# Patient Record
Sex: Female | Born: 1960 | Race: Black or African American | Hispanic: No | Marital: Married | Smoking: Former smoker
Health system: Southern US, Community
[De-identification: ages and names within clinical notes are randomized; demographics above are authoritative.]

## PROBLEM LIST (undated history)

## (undated) DIAGNOSIS — I10 Essential (primary) hypertension: Secondary | ICD-10-CM

## (undated) DIAGNOSIS — E079 Disorder of thyroid, unspecified: Secondary | ICD-10-CM

## (undated) HISTORY — PX: ABDOMINAL HYSTERECTOMY: SHX81

## (undated) HISTORY — DX: Essential (primary) hypertension: I10

## (undated) HISTORY — DX: Disorder of thyroid, unspecified: E07.9

## (undated) HISTORY — PX: TUBAL LIGATION: SHX77

---

## 2005-10-19 ENCOUNTER — Ambulatory Visit: Payer: Self-pay | Admitting: Family Medicine

## 2006-11-08 LAB — HM PAP SMEAR: HM PAP: NORMAL

## 2006-11-16 ENCOUNTER — Ambulatory Visit: Payer: Self-pay | Admitting: Family Medicine

## 2007-11-28 ENCOUNTER — Ambulatory Visit: Payer: Self-pay | Admitting: Family Medicine

## 2008-12-17 ENCOUNTER — Ambulatory Visit: Payer: Self-pay | Admitting: Family Medicine

## 2010-02-03 ENCOUNTER — Ambulatory Visit: Payer: Self-pay | Admitting: Family Medicine

## 2011-03-10 ENCOUNTER — Ambulatory Visit: Payer: Self-pay | Admitting: Family Medicine

## 2012-02-03 LAB — HM DEXA SCAN

## 2012-03-29 ENCOUNTER — Ambulatory Visit: Payer: Self-pay | Admitting: Family Medicine

## 2013-04-02 ENCOUNTER — Ambulatory Visit: Payer: Self-pay | Admitting: Family Medicine

## 2014-02-11 LAB — LIPID PANEL
Cholesterol: 188 mg/dL (ref 0–200)
HDL: 57 mg/dL (ref 35–70)
LDL Cholesterol: 117 mg/dL
LDl/HDL Ratio: 2.1
Triglycerides: 71 mg/dL (ref 40–160)

## 2014-02-11 LAB — HEPATIC FUNCTION PANEL
ALK PHOS: 61 U/L (ref 25–125)
ALT: 13 U/L (ref 7–35)
AST: 15 U/L (ref 13–35)
Bilirubin, Total: 0.4 mg/dL

## 2014-02-11 LAB — BASIC METABOLIC PANEL
BUN: 14 mg/dL (ref 4–21)
CREATININE: 0.7 mg/dL (ref <=1.1)
Glucose: 99 mg/dL
Potassium: 4 mmol/L (ref 3.4–5.3)
SODIUM: 138 mmol/L (ref 137–147)

## 2014-02-11 LAB — TSH: TSH: 1.55 u[IU]/mL (ref <=5.90)

## 2014-02-11 LAB — CBC AND DIFFERENTIAL
HEMATOCRIT: 41 % (ref 36–46)
Hemoglobin: 13.5 g/dL (ref 12.0–16.0)
Neutrophils Absolute: 42 /uL
Platelets: 301 10*3/uL (ref 150–399)

## 2014-02-14 LAB — CBC AND DIFFERENTIAL: WBC: 3.2 10^3/mL

## 2014-04-29 ENCOUNTER — Ambulatory Visit: Payer: Self-pay | Admitting: Family Medicine

## 2014-04-29 LAB — HM MAMMOGRAPHY: HM Mammogram: NORMAL

## 2015-01-17 DIAGNOSIS — E559 Vitamin D deficiency, unspecified: Secondary | ICD-10-CM | POA: Insufficient documentation

## 2015-01-17 DIAGNOSIS — E039 Hypothyroidism, unspecified: Secondary | ICD-10-CM | POA: Insufficient documentation

## 2015-01-17 DIAGNOSIS — D259 Leiomyoma of uterus, unspecified: Secondary | ICD-10-CM | POA: Insufficient documentation

## 2015-01-17 DIAGNOSIS — D649 Anemia, unspecified: Secondary | ICD-10-CM | POA: Insufficient documentation

## 2015-01-17 DIAGNOSIS — E010 Iodine-deficiency related diffuse (endemic) goiter: Secondary | ICD-10-CM | POA: Insufficient documentation

## 2015-01-17 DIAGNOSIS — E049 Nontoxic goiter, unspecified: Secondary | ICD-10-CM | POA: Insufficient documentation

## 2015-03-18 ENCOUNTER — Encounter: Payer: Self-pay | Admitting: Family Medicine

## 2015-03-18 ENCOUNTER — Ambulatory Visit (INDEPENDENT_AMBULATORY_CARE_PROVIDER_SITE_OTHER): Payer: Managed Care, Other (non HMO) | Admitting: Family Medicine

## 2015-03-18 VITALS — BP 120/60 | HR 76 | Temp 98.7°F | Resp 16 | Ht 61.5 in | Wt 134.6 lb

## 2015-03-18 DIAGNOSIS — Z1211 Encounter for screening for malignant neoplasm of colon: Secondary | ICD-10-CM

## 2015-03-18 DIAGNOSIS — Z0000 Encounter for general adult medical examination without abnormal findings: Secondary | ICD-10-CM | POA: Diagnosis not present

## 2015-03-18 DIAGNOSIS — Z124 Encounter for screening for malignant neoplasm of cervix: Secondary | ICD-10-CM

## 2015-03-18 LAB — IFOBT (OCCULT BLOOD): IFOBT: NEGATIVE

## 2015-03-18 NOTE — Progress Notes (Signed)
Patient ID: Cynthia Blankenship, female   DOB: 30-Oct-1960, 54 y.o.   MRN: [id number]       Patient: Cynthia Blankenship, Female    DOB: [birth year], 54 y.o.   MRN: [id number] Visit Date: 03/18/2015  Today's Provider: Wilhemena Durie, MD   Chief Complaint  Patient presents with  . Annual Exam    Last annual Visit was 02/11/2014  . Allergies    seasonal   Subjective:    Annual physical exam Cynthia Blankenship is a 54 y.o. female who presents today for health maintenance and complete physical. She feels well. She reports exercising: walks twice a week(7,000 steps, 30 mins a day). She reports she is sleeping well.  -----------------------------------------------------------------   Review of Systems  Constitutional: Negative.   HENT: Negative.   Eyes: Positive for itching.  Respiratory: Negative.   Cardiovascular: Negative.   Gastrointestinal: Negative.        Reports taking colace for constipation if needed." have not taking any in the last past 2 months"  Endocrine: Negative.   Genitourinary: Negative.   Musculoskeletal: Negative for joint swelling.       Hands feels tightness after work. And hand pain.  Skin: Negative.   Allergic/Immunologic: Positive for environmental allergies.       Seasonal allergies. PT reports taking benadryl for the allergies.  Neurological: Negative.   Hematological: Negative.   Psychiatric/Behavioral: Negative.     Social History She  reports that she has quit smoking. She does not have any smokeless tobacco history on file.  Patient Active Problem List   Diagnosis Date Noted  . Absolute anemia 01/17/2015  . 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 Adult hypothyroidism 01/17/2015  . Leiomyoma of uterus 01/17/2015  . Avitaminosis D 01/17/2015    Past Surgical History  Procedure Laterality Date  . Tubal ligation    . Abdominal hysterectomy      Family History Her family history includes Alcohol abuse in her father; Cirrhosis in her father; Hypertension  in her mother.    Previous Medications   CHOLECALCIFEROL (VITAMIN D) 1000 UNITS TABLET    Take by mouth.   HYDROCORTISONE (ANUSOL-HC) 2.5 % RECTAL CREAM    Place rectally.   LEVOTHYROXINE (SYNTHROID, LEVOTHROID) 75 MCG TABLET    Take by mouth.    Patient Care Team: Jerrol Banana., MD as PCP - General (Family Medicine)     Objective:   Vitals: BP 120/60 mmHg  Pulse 76  Temp(Src) 98.7 F (37.1 C) (Oral)  Resp 16  Ht 5' 1.5" (1.562 m)  Wt 134 lb 9.6 oz (61.054 kg)  BMI 25.02 kg/m2   Physical Exam  Constitutional: She is oriented to person, place, and time. She appears well-developed and well-nourished.  HENT:  Head: Normocephalic and atraumatic.  Right Ear: External ear normal.  Left Ear: External ear normal.  Nose: Nose normal.  Mouth/Throat: Oropharynx is clear and moist.  Eyes: Conjunctivae and EOM are normal. Pupils are equal, round, and reactive to light.  Neck: Normal range of motion. Neck supple.  Cardiovascular: Normal rate, regular rhythm, normal heart sounds and intact distal pulses.   Pulmonary/Chest: Effort normal and breath sounds normal.  Abdominal: Soft. Bowel sounds are normal.  Genitourinary: Vagina normal. Guaiac negative stool. No breast swelling, tenderness or discharge. Pelvic exam was performed with patient supine. No vaginal discharge found.  Patient status post hysterectomy  Musculoskeletal: Normal range of motion.  Neurological: She is alert and oriented to person, place, and time.  Skin: Skin is warm and dry.  Psychiatric: She has a normal mood and affect. Her behavior is normal. Judgment and thought content normal.     Depression Screen No flowsheet data found.    Assessment & Plan:     Routine Health Maintenance and Physical Exam  Exercise Activities and Dietary recommendations Goals    None      Immunization History  Administered Date(s) Administered  . Tdap 11/27/2007    Health Maintenance  Topic Date Due  . HIV  Screening  01/21/1976  . PAP SMEAR  11/08/2009  . COLONOSCOPY  01/21/2011  . INFLUENZA VACCINE  04/14/2015  . MAMMOGRAM  04/29/2016  . TETANUS/TDAP  11/26/2017      Discussed health benefits of physical activity, and encouraged her to engage in regular exercise appropriate for her age and condition.   Patient status post hysterectomy for profound menorrhagia   Hypothyroidism --------------------------------------------------------------------   I have done the exam and reviewed the above chart and it is accurate to the best of my knowledge.

## 2015-03-18 NOTE — Progress Notes (Deleted)
.  rlg

## 2015-03-19 LAB — COMPREHENSIVE METABOLIC PANEL
ALK PHOS: 69 IU/L (ref 39–117)
ALT: 26 IU/L (ref 0–32)
AST: 20 IU/L (ref 0–40)
Albumin/Globulin Ratio: 1.3 (ref 1.1–2.5)
Albumin: 4.4 g/dL (ref 3.5–5.5)
BUN / CREAT RATIO: 22 (ref 9–23)
BUN: 15 mg/dL (ref 6–24)
Bilirubin Total: 0.2 mg/dL (ref 0.0–1.2)
CHLORIDE: 102 mmol/L (ref 97–108)
CO2: 23 mmol/L (ref 18–29)
Calcium: 10 mg/dL (ref 8.7–10.2)
Creatinine, Ser: 0.68 mg/dL (ref 0.57–1.00)
GFR calc Af Amer: 115 mL/min/{1.73_m2} (ref >59)
GFR calc non Af Amer: 99 mL/min/{1.73_m2} (ref >59)
Globulin, Total: 3.4 g/dL (ref 1.5–4.5)
Glucose: 103 mg/dL — ABNORMAL HIGH (ref 65–99)
POTASSIUM: 4.6 mmol/L (ref 3.5–5.2)
SODIUM: 140 mmol/L (ref 134–144)
Total Protein: 7.8 g/dL (ref 6.0–8.5)

## 2015-03-19 LAB — CBC WITH DIFFERENTIAL/PLATELET
BASOS ABS: 0 10*3/uL (ref 0.0–0.2)
Basos: 0 %
EOS (ABSOLUTE): 0.1 10*3/uL (ref 0.0–0.4)
Eos: 3 %
HEMOGLOBIN: 14 g/dL (ref 11.1–15.9)
Hematocrit: 40.6 % (ref 34.0–46.6)
IMMATURE GRANS (ABS): 0 10*3/uL (ref 0.0–0.1)
IMMATURE GRANULOCYTES: 0 %
LYMPHS: 50 %
Lymphocytes Absolute: 1.8 10*3/uL (ref 0.7–3.1)
MCH: 31.2 pg (ref 26.6–33.0)
MCHC: 34.5 g/dL (ref 31.5–35.7)
MCV: 90 fL (ref 79–97)
MONOCYTES: 11 %
Monocytes Absolute: 0.4 10*3/uL (ref 0.1–0.9)
NEUTROS ABS: 1.3 10*3/uL — AB (ref 1.4–7.0)
Neutrophils: 36 %
Platelets: 291 10*3/uL (ref 150–379)
RBC: 4.49 x10E6/uL (ref 3.77–5.28)
RDW: 13.9 % (ref 12.3–15.4)
WBC: 3.7 10*3/uL (ref 3.4–10.8)

## 2015-03-19 LAB — LIPID PANEL WITH LDL/HDL RATIO
CHOLESTEROL TOTAL: 205 mg/dL — AB (ref 100–199)
HDL: 60 mg/dL (ref >39)
LDL Calculated: 125 mg/dL — ABNORMAL HIGH (ref 0–99)
LDl/HDL Ratio: 2.1 ratio units (ref 0.0–3.2)
Triglycerides: 99 mg/dL (ref 0–149)
VLDL Cholesterol Cal: 20 mg/dL (ref 5–40)

## 2015-03-19 LAB — TSH: TSH: 0.841 u[IU]/mL (ref 0.450–4.500)

## 2015-03-21 ENCOUNTER — Telehealth: Payer: Self-pay

## 2015-03-21 LAB — PAPLB, HPV, RFX16/18
HPV, high-risk: NEGATIVE
PAP SMEAR COMMENT: 0

## 2015-03-21 NOTE — Telephone Encounter (Signed)
-----   Message from Jerrol Banana., MD sent at 03/21/2015  6:40 AM EDT ----- Diet and exercise for mildly elevated lipids and sugar.

## 2015-03-21 NOTE — Telephone Encounter (Signed)
LMTCB ED 

## 2015-03-27 ENCOUNTER — Other Ambulatory Visit: Payer: Self-pay | Admitting: Family Medicine

## 2015-03-27 DIAGNOSIS — Z1231 Encounter for screening mammogram for malignant neoplasm of breast: Secondary | ICD-10-CM

## 2015-04-03 ENCOUNTER — Telehealth: Payer: Self-pay

## 2015-04-03 NOTE — Telephone Encounter (Signed)
MTCB  ED

## 2015-04-03 NOTE — Telephone Encounter (Signed)
-----   Message from Jerrol Banana., MD sent at 04/03/2015  7:13 AM EDT ----- Pap and HPV all normal.

## 2015-04-03 NOTE — Telephone Encounter (Signed)
Patient advised  ED 

## 2015-04-25 ENCOUNTER — Other Ambulatory Visit: Payer: Self-pay | Admitting: Family Medicine

## 2015-04-25 NOTE — Telephone Encounter (Signed)
Pt contacted office for refill request on the following medications: levothyroxine (SYNTHROID, LEVOTHROID) 75 MCG tablet to [facility]. Thanks TNP

## 2015-04-28 MED ORDER — LEVOTHYROXINE SODIUM 75 MCG PO TABS: 75.0000 ug | ORAL_TABLET | Freq: Every day | ORAL | Status: DC

## 2015-04-28 NOTE — Telephone Encounter (Signed)
Done-aa 

## 2015-05-01 ENCOUNTER — Ambulatory Visit
Admission: RE | Admit: 2015-05-01 | Discharge: 2015-05-01 | Disposition: A | Discharge status: Home / Self Care | Payer: Managed Care, Other (non HMO) | Source: Ambulatory Visit | Attending: Family Medicine | Admitting: Family Medicine

## 2015-05-01 DIAGNOSIS — Z1231 Encounter for screening mammogram for malignant neoplasm of breast: Secondary | ICD-10-CM | POA: Diagnosis present

## 2015-05-12 ENCOUNTER — Other Ambulatory Visit: Payer: Self-pay | Admitting: Family Medicine

## 2015-05-12 MED ORDER — LEVOTHYROXINE SODIUM 75 MCG PO TABS: 75.0000 ug | ORAL_TABLET | Freq: Every day | ORAL | Status: DC

## 2015-05-12 NOTE — Telephone Encounter (Signed)
Done-aa 

## 2015-05-12 NOTE — Telephone Encounter (Signed)
Pt contacted office for refill request on the following medications:   levothyroxine (SYNTHROID, LEVOTHROID) 75 MCG.  [place]  CB#[phone]/MW

## 2015-06-02 ENCOUNTER — Encounter: Payer: Self-pay | Admitting: Family Medicine

## 2015-06-02 ENCOUNTER — Ambulatory Visit (INDEPENDENT_AMBULATORY_CARE_PROVIDER_SITE_OTHER): Payer: Managed Care, Other (non HMO) | Admitting: Family Medicine

## 2015-06-02 VITALS — BP 128/70 | HR 94 | Temp 98.9°F | Resp 18 | Wt 134.0 lb

## 2015-06-02 DIAGNOSIS — J309 Allergic rhinitis, unspecified: Secondary | ICD-10-CM | POA: Diagnosis not present

## 2015-06-02 MED ORDER — HYDROCOD POLST-CPM POLST ER 10-8 MG/5ML PO SUER: 5.0000 mL | Freq: Two times a day (BID) | ORAL | Status: DC | PRN

## 2015-06-02 NOTE — Progress Notes (Signed)
Patient ID: Cynthia Blankenship, female   DOB: [birth year], 54 y.o.   MRN: [id number]    Subjective:  HPI Pt reports that she started feeling bad about a week ago. Her grandchildern have had a cold and she thinks they passed it to her. She thought she was getting better but then she started having a cough, she reports that she is coughing up green sputum at night. She reports that she started having a runny nose. Last week she had sore throat, chills, hoarseness and nasal congestion. Now she just has a produce cough. She reports that the cough is worse at night.    Prior to Admission medications   Medication Sig Start Date End Date Taking? Authorizing Provider  cholecalciferol (VITAMIN D) 1000 UNITS tablet Take by mouth. 02/13/14  Yes Historical Provider, MD  diphenhydrAMINE (BENADRYL) 12.5 MG/5ML liquid Take 12.5 mg by mouth daily as needed for allergies.   Yes Historical Provider, MD  levothyroxine (SYNTHROID, LEVOTHROID) 75 MCG tablet Take 1 tablet (75 mcg total) by mouth daily. 05/12/15  Yes Issiah Huffaker Maceo Pro., MD  hydrocortisone (ANUSOL-HC) 2.5 % rectal cream Place rectally. 09/24/13   Historical Provider, MD    Patient Active Problem List   Diagnosis Date Noted  . Allergic rhinitis 06/02/2015  . Absolute anemia 01/17/2015  . 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 Adult hypothyroidism 01/17/2015  . Leiomyoma of uterus 01/17/2015  . Avitaminosis D 01/17/2015    History reviewed. No pertinent past medical history.  Social History   Social History  . Marital Status: Married    Spouse Name: N/A  . Number of Children: N/A  . Years of Education: N/A   Occupational History  . Not on file.   Social History Main Topics  . Smoking status: Former Research scientist (life sciences)  . Smokeless tobacco: Not on file  . Alcohol Use: No  . Drug Use: No  . Sexual Activity: Not on file   Other Topics Concern  . Not on file   Social History Narrative    No Known Allergies  Review of Systems  Constitutional: Positive for  malaise/fatigue.  HENT: Positive for congestion. Negative for sore throat.   Eyes: Negative.   Respiratory: Positive for cough and sputum production. Negative for shortness of breath and wheezing.   Cardiovascular: Negative.   Gastrointestinal: Negative.   Genitourinary: Negative.   Musculoskeletal: Negative.   Neurological: Negative.  Negative for headaches.  Endo/Heme/Allergies: Negative.   Psychiatric/Behavioral: Negative.     Immunization History  Administered Date(s) Administered  . Tdap 11/27/2007   Objective:  BP 128/70 mmHg  Pulse 94  Temp(Src) 98.9 F (37.2 C) (Oral)  Resp 18  Wt 134 lb (60.782 kg)  SpO2 96%  Physical Exam  Constitutional: She is well-developed, well-nourished, and in no distress.  HENT:  Head: Normocephalic and atraumatic.  Right Ear: External ear normal.  Left Ear: External ear normal.  Nose: Nose normal.  Mouth/Throat: Oropharynx is clear and moist.  Eyes: Conjunctivae are normal.  Neck: Neck supple.  Cardiovascular: Normal rate, regular rhythm and normal heart sounds.   Pulmonary/Chest: Effort normal and breath sounds normal.  Skin: Skin is warm and dry.  Psychiatric: Mood, memory, affect and judgment normal.    Lab Results  Component Value Date   WBC 3.7 03/18/2015   HGB 13.5 02/11/2014   HCT 40.6 03/18/2015   PLT 301 02/11/2014   GLUCOSE 103* 03/18/2015   CHOL 205* 03/18/2015   TRIG 99 03/18/2015   HDL 60 03/18/2015  LDLCALC 125* 03/18/2015   TSH 0.841 03/18/2015    CMP     Component Value Date/Time   NA 140 03/18/2015 0932   K 4.6 03/18/2015 0932   CL 102 03/18/2015 0932   CO2 23 03/18/2015 0932   GLUCOSE 103* 03/18/2015 0932   BUN 15 03/18/2015 0932   CREATININE 0.68 03/18/2015 0932   CREATININE 0.7 02/11/2014   CALCIUM 10.0 03/18/2015 0932   PROT 7.8 03/18/2015 0932   AST 20 03/18/2015 0932   ALT 26 03/18/2015 0932   ALKPHOS 69 03/18/2015 0932   BILITOT 0.2 03/18/2015 0932   GFRNONAA 99 03/18/2015 0932    GFRAA 115 03/18/2015 0932    Assessment and Plan :  1. Allergic rhinitis, unspecified allergic rhinitis type  2. URI No antibiotics necessary. Patient will call us back if she worsens. 3. Nocturnal cough Prescription written for Tussionex 5 cc by mouth every 12 hours when necessary cough #120 cc   [facility] 06/02/2015 9:19 AM

## 2015-07-14 ENCOUNTER — Telehealth: Payer: Self-pay | Admitting: Family Medicine

## 2015-07-14 NOTE — Telephone Encounter (Signed)
Pt stated that she took her levothyroxine (SYNTHROID, LEVOTHROID) 75 MCG tablet this morning and again this afternoon by mistake. Pt wanted to know if she should still take her morning dose tomorrow morning or skip tomorrows dose. Please advise. Thanks TNP

## 2015-07-15 NOTE — Telephone Encounter (Signed)
Pt advised-aa 

## 2015-07-15 NOTE — Telephone Encounter (Signed)
lmtcb-aa 

## 2015-07-15 NOTE — Telephone Encounter (Signed)
Pt returning call.  CB#[phone]/MW

## 2015-07-15 NOTE — Telephone Encounter (Signed)
OK to skip a dose for sake of Rx accuracy with # of pills.

## 2015-11-10 ENCOUNTER — Telehealth: Payer: Self-pay | Admitting: Family Medicine

## 2016-03-18 ENCOUNTER — Encounter: Payer: Self-pay | Admitting: Family Medicine

## 2016-03-18 ENCOUNTER — Ambulatory Visit
Admission: RE | Admit: 2016-03-18 | Discharge: 2016-03-18 | Disposition: A | Discharge status: Home / Self Care | Payer: Managed Care, Other (non HMO) | Source: Ambulatory Visit | Attending: Family Medicine | Admitting: Family Medicine

## 2016-03-18 ENCOUNTER — Ambulatory Visit (INDEPENDENT_AMBULATORY_CARE_PROVIDER_SITE_OTHER): Payer: Managed Care, Other (non HMO) | Admitting: Family Medicine

## 2016-03-18 VITALS — BP 112/60 | HR 82 | Temp 98.1°F | Resp 16 | Ht 62.0 in | Wt 137.0 lb

## 2016-03-18 DIAGNOSIS — M85842 Other specified disorders of bone density and structure, left hand: Secondary | ICD-10-CM | POA: Insufficient documentation

## 2016-03-18 DIAGNOSIS — M79642 Pain in left hand: Principal | ICD-10-CM

## 2016-03-18 DIAGNOSIS — M79641 Pain in right hand: Secondary | ICD-10-CM | POA: Diagnosis not present

## 2016-03-18 DIAGNOSIS — Z0000 Encounter for general adult medical examination without abnormal findings: Secondary | ICD-10-CM | POA: Diagnosis not present

## 2016-03-18 DIAGNOSIS — M7989 Other specified soft tissue disorders: Secondary | ICD-10-CM | POA: Diagnosis not present

## 2016-03-18 LAB — POCT URINALYSIS DIPSTICK
Bilirubin, UA: NEGATIVE
Blood, UA: NEGATIVE
GLUCOSE UA: NEGATIVE
Ketones, UA: NEGATIVE
LEUKOCYTES UA: NEGATIVE
NITRITE UA: NEGATIVE
Protein, UA: NEGATIVE
Spec Grav, UA: 1.015
UROBILINOGEN UA: 2
pH, UA: 7

## 2016-03-18 MED ORDER — NAPROXEN 375 MG PO TBEC: 375.0000 mg | DELAYED_RELEASE_TABLET | Freq: Two times a day (BID) | ORAL | Status: DC

## 2016-03-18 NOTE — Progress Notes (Signed)
Patient ID: Cynthia Blankenship, female   DOB: [birth year], 55 y.o.   MRN: [id number]       Patient: Cynthia Blankenship, Female    DOB: January 07, 1961, 55 y.o.   MRN: [id number] Visit Date: 03/18/2016  Today's Provider: Wilhemena Durie, MD   Chief Complaint  Patient presents with  . Annual Exam   Subjective:    Annual physical exam Cynthia Blankenship is a 55 y.o. female who presents today for health maintenance and complete physical. She feels well. Other than her hand pain. She reports she is exercising, she is walking twice a week. She reports she is sleeping fairly well.  Mammogram- 05/01/15 WNL Pap- 03/18/15 Normal Neg HPV Colonoscopy- 01/21/11 ----------------------------------------------------------------- Pt reports that she has been having bilateral hand pain. 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 She reports that told her it was tendonitis. They did not do any x rays, or treat her with any type of anti inflammatory. This was in May and now her hand pain is getting worse. She does repetitive hand movements at work. She can not afford not to work so she has been dealing with the pain. Pt is tearful when starting to talk about her job and her hands. She reports that the pain is worse in the mornings when she gets up and when opening up bottle or cans etc.     Review of Systems  Constitutional: Negative.   HENT: Negative.   Eyes: Negative.   Respiratory: Negative.   Cardiovascular: Negative.   Gastrointestinal: Negative.   Endocrine: Negative.   Genitourinary: Negative.   Musculoskeletal: Positive for arthralgias.  Skin: Negative.   Allergic/Immunologic: Negative.   Neurological: Negative.   Hematological: Negative.   Psychiatric/Behavioral: Negative.     Social History      She  reports that she has quit smoking. She does not have any smokeless tobacco history on file. She reports that she does not drink alcohol or use illicit drugs.       Social History    Social History  . Marital Status: Married    Spouse Name: N/A  . Number of Children: N/A  . Years of Education: N/A   Social History Main Topics  . Smoking status: Former Research scientist (life sciences)  . Smokeless tobacco: None  . Alcohol Use: No  . Drug Use: No  . Sexual Activity: Not Asked   Other Topics Concern  . None   Social History Narrative    History reviewed. No pertinent past medical history.   Patient Active Problem List   Diagnosis Date Noted  . Allergic rhinitis 06/02/2015  . Absolute anemia 01/17/2015  . 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 Adult hypothyroidism 01/17/2015  . Leiomyoma of uterus 01/17/2015  . Avitaminosis D 01/17/2015  . 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 Tubal ligation    . Abdominal hysterectomy      Family History        Family Status  Relation Status Death Age  . Mother Alive   . Father Deceased   . Sister Alive   . Brother Alive   . Brother Alive   . Sister Alive   . Sister Alive         Her family history includes Alcohol abuse in her father; Cirrhosis in her father; Hypertension in her mother.    No Known Allergies  Current Meds  Medication Sig  . cholecalciferol (VITAMIN D) 1000 UNITS tablet Take  by mouth.  . diphenhydrAMINE (BENADRYL) 12.5 MG/5ML liquid Take 12.5 mg by mouth daily as needed for allergies.  Mar[facility] Kitchen levothyroxine (SYNTHROID, LEVOTHROID) 75 MCG tablet Take 1 tablet (75 mcg total) by mouth daily.    Patient Care Team: Jerrol Banana., MD as PCP - General (Family Medicine)     Objective:   Vitals: BP 112/60 mmHg  Pulse 82  Temp(Src) 98.1 F (36.7 C) (Oral)  Resp 16  Ht 5\' 2"  (1.575 m)  Wt 137 lb (62.143 kg)  BMI 25.05 kg/m2   Physical Exam  Constitutional: She is oriented to person, place, and time. She appears well-developed and well-nourished.  HENT:  Head: Normocephalic and atraumatic.  Right Ear: External ear normal.  Left Ear: External ear normal.  Nose: Nose  normal.  Mouth/Throat: Oropharynx is clear and moist.  Eyes: Conjunctivae and EOM are normal. No scleral icterus.  Neck: Neck supple. No thyromegaly present.  Cardiovascular: Normal rate, regular rhythm, normal heart sounds and intact distal pulses.   Pulmonary/Chest: Effort normal and breath sounds normal.  Abdominal: Soft. Bowel sounds are normal.  Musculoskeletal: She exhibits edema and tenderness.  Diffuse dorsal swelling with tenderness of the MCP joints of the first and second fingers of both hands.  Neurological: She is alert and oriented to person, place, and time. No cranial nerve deficit. She exhibits normal muscle tone. Coordination normal.  Skin: Skin is warm and dry.  Psychiatric: She has a normal mood and affect. Her behavior is normal. Judgment and thought content normal.     Depression Screen PHQ 2/9 Scores 03/18/2016 03/18/2015  PHQ - 2 Score 0 0      Assessment & Plan:     Routine Health Maintenance and Physical Exam  Exercise Activities and Dietary recommendations Goals    None      Immunization History  Administered Date(s) Administered  . Influenza-Unspecified 07/14/2015  . Tdap 11/27/2007    Health Maintenance  Topic Date Due  . Hepatitis C Screening  Jul 18, 1961  . HIV Screening  01/21/1976  . COLONOSCOPY  01/21/2011  . INFLUENZA VACCINE  04/13/2016  . MAMMOGRAM  04/30/2017  . TETANUS/TDAP  11/26/2017  . PAP SMEAR  03/17/2018    Pap/ DRE last year.  Discussed health benefits of physical activity, and encouraged her to engage in regular exercise appropriate for her age and condition.              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 Probably refer to rheumatology. I have done the exam and reviewed the above chart and it is accurate to the best of my knowledge.   --------------------------------------------------------------------    Wilhemena Durie, MD    [facility]

## 2016-03-19 ENCOUNTER — Telehealth: Payer: Self-pay

## 2016-03-19 DIAGNOSIS — M79641 Pain in right hand: Secondary | ICD-10-CM

## 2016-03-19 DIAGNOSIS — M79642 Pain in left hand: Principal | ICD-10-CM

## 2016-03-19 LAB — COMPREHENSIVE METABOLIC PANEL
A/G RATIO: 1.3 (ref 1.2–2.2)
ALT: 20 IU/L (ref 0–32)
AST: 17 IU/L (ref 0–40)
Albumin: 4.5 g/dL (ref 3.5–5.5)
Alkaline Phosphatase: 78 IU/L (ref 39–117)
BILIRUBIN TOTAL: 0.4 mg/dL (ref 0.0–1.2)
BUN/Creatinine Ratio: 26 — ABNORMAL HIGH (ref 9–23)
BUN: 18 mg/dL (ref 6–24)
CALCIUM: 10.4 mg/dL — AB (ref 8.7–10.2)
CHLORIDE: 105 mmol/L (ref 96–106)
CO2: 22 mmol/L (ref 18–29)
Creatinine, Ser: 0.68 mg/dL (ref 0.57–1.00)
GFR, EST AFRICAN AMERICAN: 114 mL/min/{1.73_m2} (ref >59)
GFR, EST NON AFRICAN AMERICAN: 99 mL/min/{1.73_m2} (ref >59)
GLOBULIN, TOTAL: 3.6 g/dL (ref 1.5–4.5)
Glucose: 106 mg/dL — ABNORMAL HIGH (ref 65–99)
POTASSIUM: 4.6 mmol/L (ref 3.5–5.2)
SODIUM: 140 mmol/L (ref 134–144)
TOTAL PROTEIN: 8.1 g/dL (ref 6.0–8.5)

## 2016-03-19 LAB — CBC WITH DIFFERENTIAL/PLATELET
BASOS: 1 %
Basophils Absolute: 0 10*3/uL (ref 0.0–0.2)
EOS (ABSOLUTE): 0.1 10*3/uL (ref 0.0–0.4)
EOS: 1 %
HEMATOCRIT: 38.2 % (ref 34.0–46.6)
Hemoglobin: 13.4 g/dL (ref 11.1–15.9)
IMMATURE GRANS (ABS): 0 10*3/uL (ref 0.0–0.1)
IMMATURE GRANULOCYTES: 0 %
LYMPHS: 40 %
Lymphocytes Absolute: 1.7 10*3/uL (ref 0.7–3.1)
MCH: 32 pg (ref 26.6–33.0)
MCHC: 35.1 g/dL (ref 31.5–35.7)
MCV: 91 fL (ref 79–97)
Monocytes Absolute: 0.5 10*3/uL (ref 0.1–0.9)
Monocytes: 11 %
NEUTROS PCT: 47 %
Neutrophils Absolute: 2.1 10*3/uL (ref 1.4–7.0)
PLATELETS: 336 10*3/uL (ref 150–379)
RBC: 4.19 x10E6/uL (ref 3.77–5.28)
RDW: 15.3 % (ref 12.3–15.4)
WBC: 4.3 10*3/uL (ref 3.4–10.8)

## 2016-03-19 LAB — LIPID PANEL WITH LDL/HDL RATIO
Cholesterol, Total: 214 mg/dL — ABNORMAL HIGH (ref 100–199)
HDL: 65 mg/dL (ref >39)
LDL Calculated: 135 mg/dL — ABNORMAL HIGH (ref 0–99)
LDL/HDL RATIO: 2.1 ratio (ref 0.0–3.2)
TRIGLYCERIDES: 68 mg/dL (ref 0–149)
VLDL Cholesterol Cal: 14 mg/dL (ref 5–40)

## 2016-03-19 LAB — RHEUMATOID FACTOR: RHEUMATOID FACTOR: 10.5 [IU]/mL (ref 0.0–13.9)

## 2016-03-19 LAB — ANA: Anti Nuclear Antibody(ANA): NEGATIVE

## 2016-03-19 LAB — SEDIMENTATION RATE: Sed Rate: 30 mm/hr (ref 0–40)

## 2016-03-19 LAB — TSH: TSH: 3.4 u[IU]/mL (ref 0.450–4.500)

## 2016-03-19 NOTE — Telephone Encounter (Signed)
Patient advised as below. Patient verbalizes understanding and is in agreement with treatment plan.  

## 2016-03-19 NOTE — Telephone Encounter (Signed)
Patient advised as below.  

## 2016-03-19 NOTE — Telephone Encounter (Signed)
-----   Message from Jerrol Banana., MD sent at 03/19/2016  9:37 AM EDT ----- Labs okay. Go ahead and refer to rheumatology for hand problem. Stop all calcium supplements. Return to clinic 2-3 months.

## 2016-03-19 NOTE — Telephone Encounter (Signed)
-----   Message from Jerrol Banana., MD sent at 03/19/2016  9:39 AM EDT ----- Degenerative changes of the hands. Rheumatology referral recommended.

## 2016-03-22 ENCOUNTER — Other Ambulatory Visit: Payer: Self-pay | Admitting: Family Medicine

## 2016-03-22 DIAGNOSIS — Z1231 Encounter for screening mammogram for malignant neoplasm of breast: Secondary | ICD-10-CM

## 2016-03-30 ENCOUNTER — Other Ambulatory Visit: Payer: Self-pay

## 2016-03-30 ENCOUNTER — Telehealth: Payer: Self-pay | Admitting: Family Medicine

## 2016-03-30 NOTE — Telephone Encounter (Signed)
Pt informed via voicemail

## 2016-03-30 NOTE — Telephone Encounter (Signed)
Pt advised on her voicemail-aa 

## 2016-03-30 NOTE — Telephone Encounter (Signed)
Please advise. Thanks.  

## 2016-03-30 NOTE — Telephone Encounter (Signed)
Started taking Naproxen BID on 03/22/16. She has noticed increased bowel movements, stools are a Orange-brown color and yesterday she felt Jittery after taking it. It is helping her hand pain a lot but she want to know if you think she needs to cut back to one daily instead of 2 or just stop it all together. Please advise. Thanks.   Pt says its Ok to leave message on machine if she does not answer.

## 2016-03-30 NOTE — Telephone Encounter (Signed)
Carefully

## 2016-03-30 NOTE — Telephone Encounter (Signed)
Pt is asking since she is going to stop taking the Naproxen can she start back taking advil 200mg  over the counter?  CB#[phone]/MW

## 2016-03-30 NOTE — Telephone Encounter (Signed)
Just stop it --take OTC omeprazole daily for 1 week.

## 2016-04-01 ENCOUNTER — Telehealth: Payer: Self-pay | Admitting: Family Medicine

## 2016-04-01 NOTE — Telephone Encounter (Signed)
yes

## 2016-04-01 NOTE — Telephone Encounter (Signed)
Please review-aa 

## 2016-04-01 NOTE — Telephone Encounter (Signed)
Pt is asking if she can take over the counter Tylenol 650mg  for the pain in her hands.  CB#[phone]/MW

## 2016-04-02 NOTE — Telephone Encounter (Signed)
Left message advising pt.   Thanks,   -Thorne Wirz  

## 2016-04-08 DIAGNOSIS — M7542 Impingement syndrome of left shoulder: Secondary | ICD-10-CM | POA: Insufficient documentation

## 2016-04-08 DIAGNOSIS — M2550 Pain in unspecified joint: Secondary | ICD-10-CM | POA: Insufficient documentation

## 2016-04-19 ENCOUNTER — Ambulatory Visit: Payer: Managed Care, Other (non HMO) | Admitting: Family Medicine

## 2016-04-21 NOTE — Telephone Encounter (Signed)
error 

## 2016-05-03 ENCOUNTER — Ambulatory Visit: Payer: Managed Care, Other (non HMO)

## 2016-05-06 ENCOUNTER — Ambulatory Visit
Admission: RE | Admit: 2016-05-06 | Discharge: 2016-05-06 | Disposition: A | Discharge status: Home / Self Care | Payer: Managed Care, Other (non HMO) | Source: Ambulatory Visit | Attending: Family Medicine | Admitting: Family Medicine

## 2016-05-06 DIAGNOSIS — Z1231 Encounter for screening mammogram for malignant neoplasm of breast: Secondary | ICD-10-CM | POA: Insufficient documentation

## 2016-05-07 ENCOUNTER — Other Ambulatory Visit: Payer: Self-pay | Admitting: Family Medicine

## 2016-05-07 MED ORDER — LEVOTHYROXINE SODIUM 75 MCG PO TABS: 75.0000 ug | ORAL_TABLET | Freq: Every day | ORAL | 0 refills | Status: DC

## 2016-05-07 NOTE — Telephone Encounter (Signed)
Pt advised.

## 2016-05-07 NOTE — Telephone Encounter (Signed)
Pt requesting refill

## 2016-05-07 NOTE — Telephone Encounter (Signed)
Pt contacted office for refill request on the following medications: levothyroxine (SYNTHROID, LEVOTHROID) 75 MCG tablet to [facility]. Pt stated that she only has one dose left and she didn't know she was out of refills until today. I advised Dr. Rosanna Randy is out of the office today. Pt asked if another provider could refill the medication. Please advise. Thanks TNP

## 2016-07-06 ENCOUNTER — Telehealth: Payer: Self-pay | Admitting: Family Medicine

## 2016-07-06 ENCOUNTER — Other Ambulatory Visit: Payer: Self-pay | Admitting: Physician Assistant

## 2016-07-06 NOTE — Telephone Encounter (Signed)
ERROR

## 2017-03-22 ENCOUNTER — Ambulatory Visit (INDEPENDENT_AMBULATORY_CARE_PROVIDER_SITE_OTHER): Payer: 59 | Admitting: Family Medicine

## 2017-03-22 VITALS — BP 118/64 | HR 72 | Temp 98.5°F | Resp 14 | Ht 60.5 in | Wt 140.0 lb

## 2017-03-22 DIAGNOSIS — Z0000 Encounter for general adult medical examination without abnormal findings: Secondary | ICD-10-CM | POA: Diagnosis not present

## 2017-03-22 NOTE — Progress Notes (Signed)
Patient: Stephie Xu, Female    DOB: June 28, 1961, 56 y.o.   MRN: [id number] Visit Date: 03/22/2017  Today's Provider: Wilhemena Durie, MD   Chief Complaint  Patient presents with  . Annual Exam   Subjective:  Suzzane Quilter is a 56 y.o. female who presents today for health maintenance and complete physical. She feels well. She reports exercising occassionaly. She reports she is sleeping well. She is married and now has 3 grandchildren--ages 6,4 and newborn. Immunization History  Administered Date(s) Administered  . Influenza-Unspecified 07/14/2015  . Tdap 11/27/2007     01/21/11 Colonoscopy-per patient, she will sign for records today 02/03/12 BMD 03/18/15 Pap-NL, HPV-Neg 05/06/16 Mamm-neg  Review of Systems  Constitutional: Negative.   HENT: Negative.   Eyes: Negative.   Respiratory: Negative.   Cardiovascular: Negative.   Gastrointestinal: Negative.   Endocrine: Negative.   Genitourinary: Negative.   Musculoskeletal: Positive for arthralgias.  Skin: Negative.   Allergic/Immunologic: Negative.   Neurological: Negative.   Hematological: Negative.   Psychiatric/Behavioral: Negative.     Social History   Social History  . Marital status: Married    Spouse name: N/A  . Number of children: N/A  . Years of education: N/A   Occupational History  . Not on file.   Social History Main Topics  . Smoking status: Former Research scientist (life sciences)  . Smokeless tobacco: Not on file  . Alcohol use No  . Drug use: No  . Sexual activity: Not on file   Other Topics Concern  . Not on file   Social History Narrative  . No narrative on file    Patient Active Problem List   Diagnosis Date Noted  . Allergic rhinitis 06/02/2015  . Absolute anemia 01/17/2015  . 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 Adult hypothyroidism 01/17/2015  . Leiomyoma of uterus 01/17/2015  . Avitaminosis D 01/17/2015  . 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 ABDOMINAL  HYSTERECTOMY    . TUBAL LIGATION      Her family history includes Alcohol abuse in her father; Cirrhosis in her father; Hypertension in her mother.     Outpatient Encounter Prescriptions as of 03/22/2017  Medication Sig Note  . diphenhydrAMINE (BENADRYL) 12.5 MG/5ML liquid Take 12.5 mg by mouth daily as needed for allergies.   Marland Kitchen levothyroxine (SYNTHROID, LEVOTHROID) 75 MCG tablet TAKE ONE TABLET BY MOUTH DAILY.   . [DISCONTINUED] cholecalciferol (VITAMIN D) 1000 UNITS tablet Take by mouth. 01/17/2015: OTC Received from: [facility]   No facility-administered encounter medications on file as of 03/22/2017.     Patient Care Team: Jerrol Banana., MD as PCP - General (Family Medicine)      Objective:   Vitals:  Vitals:   03/22/17 0954  BP: 118/64  Pulse: 72  Resp: 14  Temp: 98.5 F (36.9 C)  TempSrc: Oral  Weight: 140 lb (63.5 kg)  Height: 5' 0.5" (1.537 m)    Physical Exam  Constitutional: She is oriented to person, place, and time. She appears well-developed and well-nourished.  HENT:  Head: Normocephalic and atraumatic.  Right Ear: External ear normal.  Left Ear: External ear normal.  Nose: Nose normal.  Mouth/Throat: Oropharynx is clear and moist.  Eyes: Conjunctivae and EOM are normal. Pupils are equal, round, and reactive to light.  Neck: Normal range of motion. Neck supple.  Cardiovascular: Normal rate, regular rhythm, normal heart sounds and intact distal pulses.   Pulmonary/Chest: Effort normal and breath  sounds normal.  Abdominal: Soft. Bowel sounds are normal.  Musculoskeletal: Normal range of motion.  Neurological: She is alert and oriented to person, place, and time.  Skin: Skin is warm and dry.  Psychiatric: She has a normal mood and affect. Her behavior is normal. Judgment and thought content normal.     Depression Screen PHQ 2/9 Scores 03/22/2017 03/18/2016 03/18/2015  PHQ - 2 Score 0 0 0  PHQ- 9 Score 0 - -   Functional Status  Survey: Is the patient deaf or have difficulty hearing?: No Does the patient have difficulty seeing, even when wearing glasses/contacts?: No Does the patient have difficulty concentrating, remembering, or making decisions?: No Does the patient have difficulty walking or climbing stairs?: No Does the patient have difficulty dressing or bathing?: No Does the patient have difficulty doing errands alone such as visiting a doctor's office or shopping?: No  Current Exercise Habits: The patient does not participate in regular exercise at present    Fall Risk  03/22/2017 03/18/2015  Falls in the past year? No No      Assessment & Plan:     Routine Health Maintenance and Physical Exam  Exercise Activities and Dietary recommendations Goals    None      Immunization History  Administered Date(s) Administered  . Influenza-Unspecified 07/14/2015  . Tdap 11/27/2007    Health Maintenance  Topic Date Due  . Hepatitis C Screening  10/01/1960  . HIV Screening  01/21/1976  . COLONOSCOPY  01/21/2011  . INFLUENZA VACCINE  04/13/2017  . TETANUS/TDAP  11/26/2017  . PAP SMEAR  03/17/2018  . MAMMOGRAM  05/06/2018     Discussed health benefits of physical activity, and encouraged her to engage in regular exercise appropriate for her age and condition.    I have done the exam and reviewed the chart and it is accurate to the best of my knowledge. Development worker, community has been used and  any errors in dictation or transcription are unintentional. Miguel Aschoff M.D. [facility]

## 2017-03-23 ENCOUNTER — Other Ambulatory Visit: Payer: Self-pay

## 2017-03-23 LAB — CBC WITH DIFFERENTIAL/PLATELET
BASOS ABS: 0 10*3/uL (ref 0.0–0.2)
Basos: 0 %
EOS (ABSOLUTE): 0 10*3/uL (ref 0.0–0.4)
EOS: 1 %
HEMATOCRIT: 34.4 % (ref 34.0–46.6)
HEMOGLOBIN: 11.8 g/dL (ref 11.1–15.9)
IMMATURE GRANS (ABS): 0 10*3/uL (ref 0.0–0.1)
IMMATURE GRANULOCYTES: 0 %
LYMPHS: 39 %
Lymphocytes Absolute: 1.8 10*3/uL (ref 0.7–3.1)
MCH: 34.8 pg — ABNORMAL HIGH (ref 26.6–33.0)
MCHC: 34.3 g/dL (ref 31.5–35.7)
MCV: 102 fL — ABNORMAL HIGH (ref 79–97)
MONOCYTES: 8 %
Monocytes Absolute: 0.4 10*3/uL (ref 0.1–0.9)
NEUTROS PCT: 52 %
Neutrophils Absolute: 2.3 10*3/uL (ref 1.4–7.0)
Platelets: 330 10*3/uL (ref 150–379)
RBC: 3.39 x10E6/uL — AB (ref 3.77–5.28)
RDW: 14.6 % (ref 12.3–15.4)
WBC: 4.5 10*3/uL (ref 3.4–10.8)

## 2017-03-23 LAB — COMPREHENSIVE METABOLIC PANEL
ALBUMIN: 4.2 g/dL (ref 3.5–5.5)
ALT: 12 IU/L (ref 0–32)
AST: 14 IU/L (ref 0–40)
Albumin/Globulin Ratio: 1.2 (ref 1.2–2.2)
Alkaline Phosphatase: 84 IU/L (ref 39–117)
BUN / CREAT RATIO: 14 (ref 9–23)
BUN: 9 mg/dL (ref 6–24)
Bilirubin Total: 0.6 mg/dL (ref 0.0–1.2)
CALCIUM: 10 mg/dL (ref 8.7–10.2)
CO2: 23 mmol/L (ref 20–29)
CREATININE: 0.63 mg/dL (ref 0.57–1.00)
Chloride: 102 mmol/L (ref 96–106)
GFR calc Af Amer: 116 mL/min/{1.73_m2} (ref >59)
GFR, EST NON AFRICAN AMERICAN: 101 mL/min/{1.73_m2} (ref >59)
GLOBULIN, TOTAL: 3.6 g/dL (ref 1.5–4.5)
GLUCOSE: 100 mg/dL — AB (ref 65–99)
Potassium: 4.4 mmol/L (ref 3.5–5.2)
SODIUM: 138 mmol/L (ref 134–144)
Total Protein: 7.8 g/dL (ref 6.0–8.5)

## 2017-03-23 LAB — LIPID PANEL WITH LDL/HDL RATIO
CHOLESTEROL TOTAL: 166 mg/dL (ref 100–199)
HDL: 53 mg/dL (ref >39)
LDL CALC: 100 mg/dL — AB (ref 0–99)
LDL/HDL RATIO: 1.9 ratio (ref 0.0–3.2)
TRIGLYCERIDES: 63 mg/dL (ref 0–149)
VLDL CHOLESTEROL CAL: 13 mg/dL (ref 5–40)

## 2017-03-23 LAB — TSH: TSH: 0.254 u[IU]/mL — ABNORMAL LOW (ref 0.450–4.500)

## 2017-03-23 MED ORDER — LEVOTHYROXINE SODIUM 50 MCG PO TABS: 50.0000 ug | ORAL_TABLET | Freq: Every day | ORAL | 12 refills | Status: DC

## 2017-03-23 NOTE — Progress Notes (Signed)
Advised  ED 

## 2017-04-07 ENCOUNTER — Other Ambulatory Visit: Payer: Self-pay | Admitting: *Deleted

## 2017-04-07 ENCOUNTER — Other Ambulatory Visit: Payer: Self-pay | Admitting: Family Medicine

## 2017-04-07 DIAGNOSIS — Z1231 Encounter for screening mammogram for malignant neoplasm of breast: Secondary | ICD-10-CM

## 2017-05-09 ENCOUNTER — Ambulatory Visit
Admission: RE | Admit: 2017-05-09 | Discharge: 2017-05-09 | Disposition: A | Discharge status: Home / Self Care | Payer: Managed Care, Other (non HMO) | Source: Ambulatory Visit | Attending: Family Medicine | Admitting: Family Medicine

## 2017-05-09 DIAGNOSIS — Z1231 Encounter for screening mammogram for malignant neoplasm of breast: Secondary | ICD-10-CM | POA: Insufficient documentation

## 2017-08-15 ENCOUNTER — Telehealth: Payer: Self-pay | Admitting: Family Medicine

## 2017-08-15 DIAGNOSIS — E039 Hypothyroidism, unspecified: Secondary | ICD-10-CM

## 2017-08-15 DIAGNOSIS — D6489 Other specified anemias: Secondary | ICD-10-CM

## 2017-08-15 NOTE — Telephone Encounter (Signed)
Is this ok to order without office visit? You also wanted to check her CBC and B12 on next draw. Last lab work was done in July 2018. Please Mackie Pai, RMA

## 2017-08-15 NOTE — Telephone Encounter (Signed)
Pt stated that Dr. Rosanna Randy had changed her levothyroxine (SYNTHROID, LEVOTHROID) 50 MCG tablet and that she is supposed to come and have her labs done to recheck her thyroid. Pt would like to come this afternoon if possible. Please advise. Thanks TNP

## 2017-08-16 NOTE — Telephone Encounter (Signed)
Lab slip placed up front and pt advised on voicemail-Cynthia Blankenship V Melvinia Ashby, RMA

## 2017-08-16 NOTE — Telephone Encounter (Signed)
Ok thx.

## 2017-08-17 ENCOUNTER — Telehealth: Payer: Self-pay | Admitting: Family Medicine

## 2017-08-17 LAB — CBC WITH DIFFERENTIAL/PLATELET
BASOS PCT: 0.3 %
Basophils Absolute: 11 cells/uL (ref 0–200)
EOS ABS: 111 {cells}/uL (ref 15–500)
Eosinophils Relative: 3 %
HCT: 32.2 % — ABNORMAL LOW (ref 35.0–45.0)
Hemoglobin: 11.4 g/dL — ABNORMAL LOW (ref 11.7–15.5)
Lymphs Abs: 2068 cells/uL (ref 850–3900)
MCH: 37.7 pg — AB (ref 27.0–33.0)
MCHC: 35.4 g/dL (ref 32.0–36.0)
MCV: 106.6 fL — ABNORMAL HIGH (ref 80.0–100.0)
MONOS PCT: 11.1 %
MPV: 11.1 fL (ref 7.5–12.5)
Neutro Abs: 1099 cells/uL — ABNORMAL LOW (ref 1500–7800)
Neutrophils Relative %: 29.7 %
PLATELETS: 271 10*3/uL (ref 140–400)
RBC: 3.02 10*6/uL — ABNORMAL LOW (ref 3.80–5.10)
RDW: 15.7 % — ABNORMAL HIGH (ref 11.0–15.0)
TOTAL LYMPHOCYTE: 55.9 %
WBC mixed population: 411 cells/uL (ref 200–950)
WBC: 3.7 10*3/uL — AB (ref 3.8–10.8)

## 2017-08-17 LAB — VITAMIN B12: VITAMIN B 12: 198 pg/mL — AB (ref 200–1100)

## 2017-08-17 LAB — TSH: TSH: 12.17 m[IU]/L — AB (ref 0.40–4.50)

## 2017-08-17 NOTE — Telephone Encounter (Signed)
Pt returned call for lab results. Please advise. Thanks TNP

## 2017-10-17 ENCOUNTER — Telehealth: Payer: Self-pay | Admitting: Family Medicine

## 2017-10-17 DIAGNOSIS — E039 Hypothyroidism, unspecified: Secondary | ICD-10-CM

## 2017-10-17 NOTE — Telephone Encounter (Signed)
Pt stated she went to [facility] to pick up levothyroxine (SYNTHROID, LEVOTHROID) 50 MCG tablet. Pt stated that she was advised they don't have the medication it's on back order and are asking if they can change the medication manufacture to [facility]. Please advise. Thanks TNP

## 2017-10-17 NOTE — Telephone Encounter (Signed)
Please review. Thanks!  

## 2017-10-18 MED ORDER — LEVOTHYROXINE SODIUM 50 MCG PO TABS: 50.0000 ug | ORAL_TABLET | Freq: Every day | ORAL | 12 refills | Status: DC

## 2017-10-18 NOTE — Telephone Encounter (Signed)
OK to change and refill.

## 2017-10-18 NOTE — Telephone Encounter (Signed)
Pt called for an update on her medication. Pt stated that she is out of the medication. Please advise. Thanks TNP

## 2017-10-18 NOTE — Telephone Encounter (Signed)
Advised husband that med had been sent to pharmacy.

## 2018-03-22 ENCOUNTER — Encounter: Payer: Self-pay | Admitting: Family Medicine

## 2018-03-22 ENCOUNTER — Ambulatory Visit (INDEPENDENT_AMBULATORY_CARE_PROVIDER_SITE_OTHER): Payer: 59 | Admitting: Family Medicine

## 2018-03-22 VITALS — BP 122/64 | HR 64 | Temp 99.1°F | Resp 16 | Ht 61.0 in | Wt 140.0 lb

## 2018-03-22 DIAGNOSIS — E538 Deficiency of other specified B group vitamins: Secondary | ICD-10-CM | POA: Diagnosis not present

## 2018-03-22 DIAGNOSIS — Z0000 Encounter for general adult medical examination without abnormal findings: Secondary | ICD-10-CM

## 2018-03-22 DIAGNOSIS — E039 Hypothyroidism, unspecified: Secondary | ICD-10-CM | POA: Diagnosis not present

## 2018-03-22 DIAGNOSIS — D649 Anemia, unspecified: Secondary | ICD-10-CM

## 2018-03-22 NOTE — Progress Notes (Signed)
Patient: Cynthia Blankenship, Female    DOB: 1961/07/10, 57 y.o.   MRN: [id number] Visit Date: 03/22/2018  Today's Provider: Wilhemena Durie, MD   Chief Complaint  Patient presents with  . Annual Exam   Subjective:    Annual physical exam Cynthia Blankenship is a 57 y.o. female who presents today for health maintenance and complete physical. She feels well. She reports exercising occasionally . She reports she is sleeping well. She is married and the mother of 2 and randmother of 3. She does assembly work.  Colonoscopy-  Mammogram- 05/09/2017. Normal. Pap- 03/18/2015. Normal. HPV negative. S/P hysterectomy.  Tdap- 11/27/2007.      Review of Systems  Constitutional: Negative.   HENT: Negative.   Eyes: Negative.   Respiratory: Negative.   Cardiovascular: Negative.   Gastrointestinal: Negative.   Endocrine: Negative.   Genitourinary: Negative.   Musculoskeletal: Positive for arthralgias.  Skin: Negative.   Allergic/Immunologic: Negative.   Neurological: Negative.   Hematological: Negative.   Psychiatric/Behavioral: Negative.     Social History      She  reports that she has quit smoking. She has never used smokeless tobacco. She reports that she does not drink alcohol or use drugs.       Social History   Socioeconomic History  . Marital status: Married    Spouse name: Not on file  . Number of children: Not on file  . Years of education: Not on file  . Highest education level: Not on file  Occupational History  . Not on file  Social Needs  . Financial resource strain: Not on file  . Food insecurity:    Worry: Not on file    Inability: Not on file  . Transportation needs:    Medical: Not on file    Non-medical: Not on file  Tobacco Use  . Smoking status: Former Research scientist (life sciences)  . Smokeless tobacco: Never Used  Substance and Sexual Activity  . Alcohol use: No    Alcohol/week: 0.0 oz  . Drug use: No  . Sexual activity: Not on file  Lifestyle  . Physical  activity:    Days per week: Not on file    Minutes per session: Not on file  . Stress: Not on file  Relationships  . Social connections:    Talks on phone: Not on file    Gets together: Not on file    Attends religious service: Not on file    Active member of club or organization: Not on file    Attends meetings of clubs or organizations: Not on file    Relationship status: Not on file  Other Topics Concern  . Not on file  Social History Narrative  . Not on file    No past medical history on file.   Patient Active Problem List   Diagnosis Date Noted  . Allergic rhinitis 06/02/2015  . Absolute anemia 01/17/2015  . 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 Adult hypothyroidism 01/17/2015  . Leiomyoma of uterus 01/17/2015  . Avitaminosis D 01/17/2015  . 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 ABDOMINAL HYSTERECTOMY    . TUBAL LIGATION      Family History        Family Status  Relation Name Status  . Mother  Deceased  . Father  Deceased  . Sister  Alive  . Brother  Alive  . Brother  Alive  . Sister  Alive  . Sister  Alive        Her family history includes Alcohol abuse in her father; Cirrhosis in her father; Hypertension in her mother.      No Known Allergies   Current Outpatient Medications:  .  levothyroxine (SYNTHROID, LEVOTHROID) 50 MCG tablet, Take 1 tablet (50 mcg total) by mouth daily., Disp: 30 tablet, Rfl: 12 .  diphenhydrAMINE (BENADRYL) 12.5 MG/5ML liquid, Take 12.5 mg by mouth daily as needed for allergies., Disp: , Rfl:    Patient Care Team: Jerrol Banana., MD as PCP - General (Family Medicine)      Objective:   Vitals: BP 122/64 (BP Location: Left Arm, Patient Position: Sitting, Cuff Size: Normal)   Pulse 64   Temp 99.1 F (37.3 C)   Resp 16   Ht 5\' 1"  (1.549 m)   Wt 140 lb (63.5 kg)   SpO2 99%   BMI 26.45 kg/m    Vitals:   03/22/18 0855  BP: 122/64  Pulse: 64  Resp: 16  Temp: 99.1 F  (37.3 C)  SpO2: 99%  Weight: 140 lb (63.5 kg)  Height: 5\' 1"  (1.549 m)     Physical Exam  Constitutional: She is oriented to person, place, and time. She appears well-developed and well-nourished.  HENT:  Head: Normocephalic and atraumatic.  Right Ear: External ear normal.  Left Ear: External ear normal.  Eyes: Conjunctivae are normal. No scleral icterus.  Neck: No thyromegaly present.  Cardiovascular: Normal rate, regular rhythm and normal heart sounds.  Pulmonary/Chest: Effort normal and breath sounds normal.  Abdominal: Soft.  Musculoskeletal: She exhibits no edema.  Lymphadenopathy:    She has no cervical adenopathy.  Neurological: She is alert and oriented to person, place, and time.  Skin: Skin is warm and dry.  Psychiatric: She has a normal mood and affect. Her behavior is normal. Judgment and thought content normal.     Depression Screen PHQ 2/9 Scores 03/22/2018 03/22/2017 03/18/2016 03/18/2015  PHQ - 2 Score 0 0 0 0  PHQ- 9 Score 0 0 - -      Assessment & Plan:     Routine Health Maintenance and Physical Exam  Exercise Activities and Dietary recommendations Goals    None      Immunization History  Administered Date(s) Administered  . Influenza-Unspecified 07/14/2015  . Tdap 11/27/2007    Health Maintenance  Topic Date Due  . HIV Screening  01/21/1976  . COLONOSCOPY  01/21/2011  . TETANUS/TDAP  11/26/2017  . PAP SMEAR  03/17/2018  . INFLUENZA VACCINE  04/13/2018  . MAMMOGRAM  05/10/2019  . Hepatitis C Screening  Completed    Colonoscopy last year. Discussed health benefits of physical activity, and encouraged her to engage in regular exercise appropriate for her age and condition.  B12  Deficiency Oral B12 daily--repeat level end of year.    Richard Cranford Mon, MD  [facility]

## 2018-03-22 NOTE — Patient Instructions (Signed)
Start over the counter Vitamin B12 supplement once daily.

## 2018-03-23 LAB — CBC WITH DIFFERENTIAL/PLATELET
BASOS: 0 %
Basophils Absolute: 0 10*3/uL (ref 0.0–0.2)
EOS (ABSOLUTE): 0.1 10*3/uL (ref 0.0–0.4)
EOS: 2 %
HEMATOCRIT: 35.6 % (ref 34.0–46.6)
HEMOGLOBIN: 12.1 g/dL (ref 11.1–15.9)
IMMATURE GRANS (ABS): 0 10*3/uL (ref 0.0–0.1)
IMMATURE GRANULOCYTES: 0 %
LYMPHS: 55 %
Lymphocytes Absolute: 1.6 10*3/uL (ref 0.7–3.1)
MCH: 36.1 pg — ABNORMAL HIGH (ref 26.6–33.0)
MCHC: 34 g/dL (ref 31.5–35.7)
MCV: 106 fL — ABNORMAL HIGH (ref 79–97)
MONOCYTES: 9 %
Monocytes Absolute: 0.3 10*3/uL (ref 0.1–0.9)
NEUTROS PCT: 34 %
Neutrophils Absolute: 1 10*3/uL — ABNORMAL LOW (ref 1.4–7.0)
Platelets: 269 10*3/uL (ref 150–450)
RBC: 3.35 x10E6/uL — AB (ref 3.77–5.28)
RDW: 16.5 % — ABNORMAL HIGH (ref 12.3–15.4)
WBC: 2.9 10*3/uL — AB (ref 3.4–10.8)

## 2018-03-23 LAB — COMPREHENSIVE METABOLIC PANEL
ALK PHOS: 67 IU/L (ref 39–117)
ALT: 14 IU/L (ref 0–32)
AST: 16 IU/L (ref 0–40)
Albumin/Globulin Ratio: 1.3 (ref 1.2–2.2)
Albumin: 4.2 g/dL (ref 3.5–5.5)
BUN/Creatinine Ratio: 12 (ref 9–23)
BUN: 9 mg/dL (ref 6–24)
Bilirubin Total: 0.5 mg/dL (ref 0.0–1.2)
CALCIUM: 9.5 mg/dL (ref 8.7–10.2)
CO2: 25 mmol/L (ref 20–29)
Chloride: 104 mmol/L (ref 96–106)
Creatinine, Ser: 0.73 mg/dL (ref 0.57–1.00)
GFR calc Af Amer: 106 mL/min/{1.73_m2} (ref >59)
GFR, EST NON AFRICAN AMERICAN: 92 mL/min/{1.73_m2} (ref >59)
GLOBULIN, TOTAL: 3.3 g/dL (ref 1.5–4.5)
Glucose: 93 mg/dL (ref 65–99)
Potassium: 4.3 mmol/L (ref 3.5–5.2)
SODIUM: 142 mmol/L (ref 134–144)
Total Protein: 7.5 g/dL (ref 6.0–8.5)

## 2018-03-23 LAB — LIPID PANEL
CHOL/HDL RATIO: 3.2 ratio (ref 0.0–4.4)
CHOLESTEROL TOTAL: 186 mg/dL (ref 100–199)
HDL: 58 mg/dL (ref >39)
LDL CALC: 116 mg/dL — AB (ref 0–99)
TRIGLYCERIDES: 60 mg/dL (ref 0–149)
VLDL Cholesterol Cal: 12 mg/dL (ref 5–40)

## 2018-03-23 LAB — TSH: TSH: 15.15 u[IU]/mL — ABNORMAL HIGH (ref 0.450–4.500)

## 2018-04-03 ENCOUNTER — Telehealth: Payer: Self-pay

## 2018-04-03 DIAGNOSIS — E039 Hypothyroidism, unspecified: Secondary | ICD-10-CM

## 2018-04-03 MED ORDER — LEVOTHYROXINE SODIUM 100 MCG PO TABS: 100.0000 ug | ORAL_TABLET | Freq: Every day | ORAL | 12 refills | Status: DC

## 2018-04-03 NOTE — Telephone Encounter (Signed)
-----   Message from Jerrol Banana., MD sent at 03/30/2018  1:51 PM EDT ----- Pt needs to start B12 shots if not on them yet. Also increase synthroid by 76mcg daily.

## 2018-04-03 NOTE — Telephone Encounter (Signed)
Left message to call back  

## 2018-04-03 NOTE — Telephone Encounter (Signed)
Advised patient of results. Scheduled patient to have B12 injections. Patient was already on levothyroxine 63mcg. Per Dr. Rosanna Randy she is to increase to 145mcg daily.

## 2018-04-05 ENCOUNTER — Ambulatory Visit (INDEPENDENT_AMBULATORY_CARE_PROVIDER_SITE_OTHER): Payer: 59 | Admitting: Family Medicine

## 2018-04-05 DIAGNOSIS — E538 Deficiency of other specified B group vitamins: Secondary | ICD-10-CM | POA: Diagnosis not present

## 2018-04-05 MED ORDER — CYANOCOBALAMIN 1000 MCG/ML IJ SOLN
1000.0000 ug | Freq: Once | INTRAMUSCULAR | Status: AC
  Administered 2018-04-05: 1000 ug via INTRAMUSCULAR

## 2018-04-10 NOTE — Progress Notes (Signed)
Start B12 shots.

## 2018-04-12 ENCOUNTER — Ambulatory Visit (INDEPENDENT_AMBULATORY_CARE_PROVIDER_SITE_OTHER): Payer: 59 | Admitting: Family Medicine

## 2018-04-12 DIAGNOSIS — E538 Deficiency of other specified B group vitamins: Secondary | ICD-10-CM | POA: Diagnosis not present

## 2018-04-12 MED ORDER — CYANOCOBALAMIN 1000 MCG/ML IJ SOLN
1000.0000 ug | Freq: Once | INTRAMUSCULAR | Status: AC
  Administered 2018-04-12: 1000 ug via INTRAMUSCULAR

## 2018-04-13 ENCOUNTER — Other Ambulatory Visit: Payer: Self-pay | Admitting: Family Medicine

## 2018-04-13 DIAGNOSIS — Z1231 Encounter for screening mammogram for malignant neoplasm of breast: Secondary | ICD-10-CM

## 2018-04-13 NOTE — Progress Notes (Signed)
Shot given

## 2018-04-19 ENCOUNTER — Ambulatory Visit (INDEPENDENT_AMBULATORY_CARE_PROVIDER_SITE_OTHER): Payer: 59 | Admitting: Family Medicine

## 2018-04-19 DIAGNOSIS — E538 Deficiency of other specified B group vitamins: Secondary | ICD-10-CM | POA: Diagnosis not present

## 2018-04-19 MED ORDER — CYANOCOBALAMIN 1000 MCG/ML IJ SOLN
1000.0000 ug | Freq: Once | INTRAMUSCULAR | Status: AC
  Administered 2018-04-19: 1000 ug via INTRAMUSCULAR

## 2018-04-24 NOTE — Progress Notes (Signed)
Nurse only visit for injection. No MD visit.

## 2018-04-26 ENCOUNTER — Ambulatory Visit (INDEPENDENT_AMBULATORY_CARE_PROVIDER_SITE_OTHER): Payer: 59 | Admitting: Physician Assistant

## 2018-04-26 DIAGNOSIS — E538 Deficiency of other specified B group vitamins: Secondary | ICD-10-CM

## 2018-04-26 MED ORDER — CYANOCOBALAMIN 1000 MCG/ML IJ SOLN
1000.0000 ug | Freq: Once | INTRAMUSCULAR | Status: AC
  Administered 2018-04-26: 1000 ug via INTRAMUSCULAR

## 2018-04-26 NOTE — Progress Notes (Signed)
B12 injection only. No E/M required

## 2018-05-11 ENCOUNTER — Ambulatory Visit
Admission: RE | Admit: 2018-05-11 | Discharge: 2018-05-11 | Disposition: A | Discharge status: Home / Self Care | Payer: 59 | Source: Ambulatory Visit | Attending: Family Medicine | Admitting: Family Medicine

## 2018-05-11 DIAGNOSIS — Z1231 Encounter for screening mammogram for malignant neoplasm of breast: Secondary | ICD-10-CM | POA: Diagnosis present

## 2018-05-23 ENCOUNTER — Ambulatory Visit (INDEPENDENT_AMBULATORY_CARE_PROVIDER_SITE_OTHER): Payer: 59 | Admitting: Family Medicine

## 2018-05-23 DIAGNOSIS — E538 Deficiency of other specified B group vitamins: Secondary | ICD-10-CM

## 2018-05-23 MED ORDER — CYANOCOBALAMIN 1000 MCG/ML IJ SOLN
1000.0000 ug | Freq: Once | INTRAMUSCULAR | Status: AC
  Administered 2018-05-23: 1000 ug via INTRAMUSCULAR

## 2018-05-24 ENCOUNTER — Ambulatory Visit: Payer: Self-pay | Admitting: Family Medicine

## 2018-05-27 NOTE — Progress Notes (Signed)
B12 shot only. °

## 2018-07-04 ENCOUNTER — Ambulatory Visit (INDEPENDENT_AMBULATORY_CARE_PROVIDER_SITE_OTHER): Payer: 59 | Admitting: Family Medicine

## 2018-07-04 DIAGNOSIS — D531 Other megaloblastic anemias, not elsewhere classified: Secondary | ICD-10-CM | POA: Diagnosis not present

## 2018-07-04 MED ORDER — CYANOCOBALAMIN 1000 MCG/ML IJ SOLN
1000.0000 ug | Freq: Once | INTRAMUSCULAR | Status: AC
  Administered 2018-07-04: 1000 ug via INTRAMUSCULAR

## 2018-07-16 NOTE — Progress Notes (Signed)
B12 shot only. °

## 2018-08-02 ENCOUNTER — Ambulatory Visit (INDEPENDENT_AMBULATORY_CARE_PROVIDER_SITE_OTHER): Payer: 59 | Admitting: Family Medicine

## 2018-08-02 DIAGNOSIS — E538 Deficiency of other specified B group vitamins: Secondary | ICD-10-CM | POA: Diagnosis not present

## 2018-08-02 MED ORDER — CYANOCOBALAMIN 1000 MCG/ML IJ SOLN
1000.0000 ug | Freq: Once | INTRAMUSCULAR | Status: AC
  Administered 2018-08-02: 1000 ug via INTRAMUSCULAR

## 2018-09-04 ENCOUNTER — Ambulatory Visit (INDEPENDENT_AMBULATORY_CARE_PROVIDER_SITE_OTHER): Payer: 59 | Admitting: Family Medicine

## 2018-09-04 DIAGNOSIS — D531 Other megaloblastic anemias, not elsewhere classified: Secondary | ICD-10-CM

## 2018-09-04 MED ORDER — CYANOCOBALAMIN 1000 MCG/ML IJ SOLN
1000.0000 ug | Freq: Once | INTRAMUSCULAR | Status: AC
  Administered 2018-09-04: 1000 ug via INTRAMUSCULAR

## 2018-09-05 NOTE — Progress Notes (Signed)
Shot only 

## 2018-09-25 ENCOUNTER — Ambulatory Visit: Payer: 59 | Admitting: Family Medicine

## 2018-09-25 ENCOUNTER — Encounter: Payer: Self-pay | Admitting: Family Medicine

## 2018-09-25 VITALS — BP 122/80 | HR 112 | Temp 99.2°F | Resp 16 | Ht 61.0 in | Wt 135.0 lb

## 2018-09-25 DIAGNOSIS — R059 Cough, unspecified: Secondary | ICD-10-CM

## 2018-09-25 DIAGNOSIS — R05 Cough: Secondary | ICD-10-CM

## 2018-09-25 DIAGNOSIS — M25522 Pain in left elbow: Secondary | ICD-10-CM

## 2018-09-25 DIAGNOSIS — J042 Acute laryngotracheitis: Secondary | ICD-10-CM

## 2018-09-25 DIAGNOSIS — E039 Hypothyroidism, unspecified: Secondary | ICD-10-CM

## 2018-09-25 DIAGNOSIS — E538 Deficiency of other specified B group vitamins: Secondary | ICD-10-CM | POA: Diagnosis not present

## 2018-09-25 MED ORDER — NAPROXEN 375 MG PO TABS: 375.0000 mg | ORAL_TABLET | Freq: Two times a day (BID) | ORAL | 0 refills | Status: DC

## 2018-09-25 MED ORDER — HYDROCOD POLST-CPM POLST ER 10-8 MG/5ML PO SUER: 5.0000 mL | Freq: Two times a day (BID) | ORAL | 0 refills | Status: DC | PRN

## 2018-09-25 NOTE — Progress Notes (Signed)
Patient: Cynthia Blankenship Female    DOB: 13-Jun-1961   58 y.o.   MRN: [id number] Visit Date: 09/25/2018  Today's Provider: Wilhemena Durie, MD   Chief Complaint  Patient presents with  . Follow-up  . Cough  . Elbow Pain   Subjective:     HPI  Patient comes in today for a 6 month follow up. She reports that she has been tolerating the B12 injections well.  Patient is also currently taking levothyroxine 171mcg once daily, and reports good compliance and good symptom control.  Lab Results  Component Value Date   TSH 15.150 (H) 03/22/2018   Patient also mentions that she has a persistent cough that has lasted about 2 weeks. She reports that she has taken left over prescription cough syrup, and it has helped a little. She has had low grade fever with this. She has taken Benadryl with minimal relief.  He is not running fever.  No shortness of breath, her main issue recently is laryngitis.  No facial pain or sinus congestion to speak of.  Cough is keeping her awake at night.  She also reports that she has had ongoing pain in her left elbow for several weeks. She wears a brace to help with this but it keeps getting inflamed due to the type of work she does.    No Known Allergies   Current Outpatient Medications:  .  diphenhydrAMINE (BENADRYL) 12.5 MG/5ML liquid, Take 12.5 mg by mouth daily as needed for allergies., Disp: , Rfl:  .  levothyroxine (SYNTHROID, LEVOTHROID) 100 MCG tablet, Take 1 tablet (100 mcg total) by mouth daily., Disp: 30 tablet, Rfl: 12  Review of Systems  Constitutional: Positive for activity change, fatigue and fever. Negative for appetite change, chills, diaphoresis and unexpected weight change.  HENT: Positive for congestion, postnasal drip, sneezing, sore throat and voice change.   Eyes: Negative.   Respiratory: Positive for cough.   Cardiovascular: Negative for chest pain, palpitations and leg swelling.  Gastrointestinal: Negative.     Musculoskeletal: Positive for arthralgias and joint swelling.  Neurological: Positive for headaches. Negative for dizziness and light-headedness.  Psychiatric/Behavioral: Positive for sleep disturbance. Negative for agitation, dysphoric mood, self-injury and suicidal ideas. The patient is not nervous/anxious.     Social History   Tobacco Use  . Smoking status: Former Research scientist (life sciences)  . Smokeless tobacco: Never Used  Substance Use Topics  . Alcohol use: No    Alcohol/week: 0.0 standard drinks      Objective:   BP 122/80 (BP Location: Left Arm, Patient Position: Sitting, Cuff Size: Normal)   Pulse (!) 112   Temp 99.2 F (37.3 C)   Resp 16   Ht 5\' 1"  (1.549 m)   Wt 135 lb (61.2 kg)   SpO2 96%   BMI 25.51 kg/m  Vitals:   09/25/18 0828  BP: 122/80  Pulse: (!) 112  Resp: 16  Temp: 99.2 F (37.3 C)  SpO2: 96%  Weight: 135 lb (61.2 kg)  Height: 5\' 1"  (1.549 m)     Physical Exam Constitutional:      Appearance: Normal appearance. She is well-developed.  HENT:     Head: Normocephalic and atraumatic.     Right Ear: Tympanic membrane and external ear normal.     Left Ear: Tympanic membrane and external ear normal.     Mouth/Throat:     Pharynx: Oropharynx is clear.  Eyes:     General: No scleral icterus.  Conjunctiva/sclera: Conjunctivae normal.  Neck:     Thyroid: No thyromegaly.  Cardiovascular:     Rate and Rhythm: Normal rate and regular rhythm.     Heart sounds: Normal heart sounds.  Pulmonary:     Effort: Pulmonary effort is normal.     Breath sounds: Normal breath sounds.  Abdominal:     Palpations: Abdomen is soft.  Musculoskeletal:        General: Swelling present.     Comments: She has mild tenderness over the left epicondyles but also some swelling in the ulnar groove just between the epicondyle and the elbow.  Lymphadenopathy:     Cervical: No cervical adenopathy.  Skin:    General: Skin is warm and dry.  Neurological:     General: No focal deficit  present.     Mental Status: She is alert and oriented to person, place, and time. Mental status is at baseline.  Psychiatric:        Mood and Affect: Mood normal.        Behavior: Behavior normal.        Thought Content: Thought content normal.        Judgment: Judgment normal.         Assessment & Plan    1. B12 deficiency Check level, continue B12 - Vitamin B12  2. Adult hypothyroidism Check TSH after dose adjustment. - TSH  3. Cough Patient is not sleeping at night.  Tussionex prescription given - chlorpheniramine-HYDROcodone (TUSSIONEX PENNKINETIC ER) 10-8 MG/5ML SUER; Take 5 mLs by mouth every 12 (twelve) hours as needed for cough.  Dispense: 120 mL; Refill: 0  4. Left elbow pain He has a lateral epicondylitis.  Refer  to Ortho  due to the swelling over the ulnar groove.  May be a synovial cyst. - naproxen (NAPROSYN) 375 MG tablet; Take 1 tablet (375 mg total) by mouth 2 (two) times daily with a meal.  Dispense: 60 tablet; Refill: 0 - Ambulatory referral to Orthopedic Surgery  5. Acute laryngotracheitis If symptoms get worse will call in Z-Pak.  We discussed that I think this is all viral at this time.     I have done the exam and reviewed the above chart and it is accurate to the best of my knowledge. Development worker, community has been used in this note in any air is in the dictation or transcription are unintentional.  Wilhemena Durie, MD  [facility]

## 2018-09-26 ENCOUNTER — Other Ambulatory Visit: Payer: Self-pay

## 2018-09-26 ENCOUNTER — Telehealth: Payer: Self-pay | Admitting: Family Medicine

## 2018-09-26 DIAGNOSIS — E039 Hypothyroidism, unspecified: Secondary | ICD-10-CM

## 2018-09-26 LAB — TSH: TSH: 0.032 u[IU]/mL — ABNORMAL LOW (ref 0.450–4.500)

## 2018-09-26 LAB — VITAMIN B12: Vitamin B-12: 501 pg/mL (ref 232–1245)

## 2018-09-26 MED ORDER — LEVOTHYROXINE SODIUM 50 MCG PO TABS: 50.0000 ug | ORAL_TABLET | Freq: Every day | ORAL | 12 refills | Status: DC

## 2018-09-26 NOTE — Telephone Encounter (Signed)
-----   Message from Jerrol Banana., MD sent at 09/26/2018  8:14 AM EST ----- Decrease Synthroid to 50 mcg daily from 100

## 2018-09-26 NOTE — Telephone Encounter (Signed)
LVMTRC 

## 2018-09-26 NOTE — Telephone Encounter (Signed)
Patient is returning a call to [facility]

## 2019-02-13 ENCOUNTER — Telehealth: Payer: Self-pay

## 2019-02-13 ENCOUNTER — Other Ambulatory Visit: Payer: Self-pay

## 2019-02-13 DIAGNOSIS — J309 Allergic rhinitis, unspecified: Secondary | ICD-10-CM

## 2019-02-13 MED ORDER — CETIRIZINE HCL 10 MG PO TABS: 10.0000 mg | ORAL_TABLET | Freq: Every day | ORAL | 11 refills | Status: DC

## 2019-02-13 NOTE — Telephone Encounter (Signed)
Zyrtec 10mg  daily prn,#30,11rf==thx

## 2019-02-13 NOTE — Telephone Encounter (Signed)
Please review. Thanks!  

## 2019-02-13 NOTE — Telephone Encounter (Signed)
Patient called office stating that she has had itching of the face for one week, patient states that she believes that it is allergy related and states symptoms did not start until she had went outside. Patient reports itching and swelling around eyes, she states that Dr. Rosanna Randy has given her prescription Benadryl before in past and it has helped. I asked patient if she had taken any Bendaryl within the past 24hrs and she states that she has but that it does not help her much. Patient reports that itching "comes and goes". Patient wanted to be seen in office today by Dr. Rosanna Randy, I explained to patient that he had no more openings and suggested for patient to see P.A she declined stating that she only wanted to see Dr. Rosanna Randy I let her know next opening would not be until Thursday with him. Patient would request that Dr. Rosanna Randy send her in prescription to help with itching. Please advise. KW

## 2019-02-15 ENCOUNTER — Ambulatory Visit: Payer: 59 | Admitting: Family Medicine

## 2019-02-21 ENCOUNTER — Telehealth: Payer: Self-pay

## 2019-02-21 NOTE — Telephone Encounter (Signed)
Patient calling that she was prescribed Zyrtec and this is the 8th day of taking it at night and it has been working pretty well for her. She reports that today she had another flare up of hives and is asking if she can take the zyrtec in the morning instead of the night? If yes does she need to skip tonight dosage and start tomorrow morning or if she can take one at night and one in the morning?  Please advise.  Thanks

## 2019-02-22 ENCOUNTER — Other Ambulatory Visit: Payer: Self-pay

## 2019-02-22 ENCOUNTER — Ambulatory Visit: Payer: 59 | Admitting: Family Medicine

## 2019-02-22 ENCOUNTER — Encounter: Payer: Self-pay | Admitting: Family Medicine

## 2019-02-22 VITALS — BP 142/70 | HR 112 | Temp 100.0°F | Resp 16 | Ht 59.0 in | Wt 139.0 lb

## 2019-02-22 DIAGNOSIS — L03211 Cellulitis of face: Secondary | ICD-10-CM

## 2019-02-22 MED ORDER — AMOXICILLIN-POT CLAVULANATE 875-125 MG PO TABS: 1.0000 | ORAL_TABLET | Freq: Two times a day (BID) | ORAL | 0 refills | Status: DC

## 2019-02-22 NOTE — Telephone Encounter (Signed)
Patient coming in today to be evaluated.

## 2019-02-22 NOTE — Progress Notes (Signed)
Patient: Cynthia Blankenship Female    DOB: Apr 16, 1961   58 y.o.   MRN: [id number] Visit Date: 02/22/2019  Today's Provider: Wilhemena Durie, MD   Chief Complaint  Patient presents with  . Rash   Subjective:     Rash This is a new problem. The current episode started in the past 7 days. The problem has been gradually worsening since onset. The affected locations include the left eye and face. The rash is characterized by itchiness, redness, dryness and swelling. She was exposed to plant contact. Pertinent negatives include no cough, fever or shortness of breath.  She has had exposure to poison oak but her face is getting worse. More swelling and irritation.No visual issues and no known fever. Patient reports that she was helping her husband cut down a tree, and the tree limbs may have gotten on her face. She reports that she was allergic to poison ivy when she was a child.   No Known Allergies   Current Outpatient Medications:  .  cetirizine (ZYRTEC) 10 MG tablet, Take 1 tablet (10 mg total) by mouth daily., Disp: 30 tablet, Rfl: 11 .  chlorpheniramine-HYDROcodone (TUSSIONEX PENNKINETIC ER) 10-8 MG/5ML SUER, Take 5 mLs by mouth every 12 (twelve) hours as needed for cough., Disp: 120 mL, Rfl: 0 .  diphenhydrAMINE (BENADRYL) 12.5 MG/5ML liquid, Take 12.5 mg by mouth daily as needed for allergies., Disp: , Rfl:  .  levothyroxine (SYNTHROID, LEVOTHROID) 50 MCG tablet, Take 1 tablet (50 mcg total) by mouth daily., Disp: 30 tablet, Rfl: 12 .  naproxen (NAPROSYN) 375 MG tablet, Take 1 tablet (375 mg total) by mouth 2 (two) times daily with a meal., Disp: 60 tablet, Rfl: 0  Review of Systems  Constitutional: Negative for activity change, appetite change, chills, fever and unexpected weight change.  Respiratory: Negative for apnea, cough, shortness of breath and wheezing.   Skin: Positive for color change and rash. Negative for pallor and wound.  Allergic/Immunologic: Negative for  environmental allergies.  Hematological: Does not bruise/bleed easily.  Psychiatric/Behavioral: Negative.     Social History   Tobacco Use  . Smoking status: Former Research scientist (life sciences)  . Smokeless tobacco: Never Used  Substance Use Topics  . Alcohol use: No    Alcohol/week: 0.0 standard drinks      Objective:   BP (!) 142/70 (BP Location: Left Arm, Patient Position: Sitting, Cuff Size: Normal)   Pulse (!) 112   Temp 100 F (37.8 C)   Resp 16   Ht 4\' 11"  (1.499 m)   Wt 139 lb (63 kg)   SpO2 100%   BMI 28.07 kg/m  Vitals:   02/22/19 1457  BP: (!) 142/70  Pulse: (!) 112  Resp: 16  Temp: 100 F (37.8 C)  SpO2: 100%  Weight: 139 lb (63 kg)  Height: 4\' 11"  (1.499 m)     Physical Exam Constitutional:      Appearance: Normal appearance.  HENT:     Head: Normocephalic and atraumatic.     Right Ear: Tympanic membrane normal.     Left Ear: Tympanic membrane normal.  Eyes:     General: No scleral icterus.       Left eye: No discharge.     Extraocular Movements: Extraocular movements intact.     Pupils: Pupils are equal, round, and reactive to light.  Cardiovascular:     Rate and Rhythm: Normal rate and regular rhythm.     Heart sounds: Normal heart  sounds.  Pulmonary:     Effort: Pulmonary effort is normal.     Breath sounds: Normal breath sounds.  Lymphadenopathy:     Cervical: No cervical adenopathy.  Skin:    General: Skin is warm and dry.     Findings: Erythema and rash present.     Comments: Rash and swelling around most of her face .   Neurological:     General: No focal deficit present.     Mental Status: She is alert.  Psychiatric:        Mood and Affect: Mood normal.        Behavior: Behavior normal.        Thought Content: Thought content normal.        Judgment: Judgment normal.         Assessment & Plan    1. Cellulitis of face I informed pt this appears to be a secondary cellulitis but will see her in a few days to recheck.  Could be poison oak  but I am concerned steroids would possibly make cellulitis worse.  - amoxicillin-clavulanate (AUGMENTIN) 875-125 MG tablet; Take 1 tablet by mouth 2 (two) times daily.  Dispense: 20 tablet; Refill: 0     Richard Cranford Mon, MD  [facility]

## 2019-02-22 NOTE — Telephone Encounter (Signed)
Yes it is ok

## 2019-02-26 ENCOUNTER — Ambulatory Visit: Payer: Self-pay | Admitting: Family Medicine

## 2019-02-26 ENCOUNTER — Telehealth: Payer: Self-pay

## 2019-02-26 NOTE — Telephone Encounter (Signed)
Please advise. Thanks.  

## 2019-02-26 NOTE — Telephone Encounter (Signed)
Patient canceled appointment for this afternoon she states she is better. She states amoxicillin-clavulanate (AUGMENTIN) 875-125 MG tablet is causing diarrhea. She stopped taking the medication. She wants to be sure that is ok. CB#[phone]

## 2019-02-26 NOTE — Telephone Encounter (Signed)
As long as she is getting back to normal.

## 2019-02-26 NOTE — Telephone Encounter (Signed)
LM advising the patient

## 2019-03-27 ENCOUNTER — Encounter: Payer: Self-pay | Admitting: Family Medicine

## 2019-03-28 ENCOUNTER — Other Ambulatory Visit: Payer: Self-pay

## 2019-03-28 ENCOUNTER — Ambulatory Visit: Payer: 59 | Admitting: Family Medicine

## 2019-03-28 ENCOUNTER — Encounter: Payer: Self-pay | Admitting: Family Medicine

## 2019-03-28 VITALS — BP 114/66 | HR 73 | Temp 98.7°F | Ht 60.5 in | Wt 143.0 lb

## 2019-03-28 DIAGNOSIS — Z1211 Encounter for screening for malignant neoplasm of colon: Secondary | ICD-10-CM

## 2019-03-28 DIAGNOSIS — E538 Deficiency of other specified B group vitamins: Secondary | ICD-10-CM

## 2019-03-28 DIAGNOSIS — Z0000 Encounter for general adult medical examination without abnormal findings: Secondary | ICD-10-CM

## 2019-03-28 DIAGNOSIS — Z1231 Encounter for screening mammogram for malignant neoplasm of breast: Secondary | ICD-10-CM

## 2019-03-28 DIAGNOSIS — E039 Hypothyroidism, unspecified: Secondary | ICD-10-CM | POA: Diagnosis not present

## 2019-03-28 DIAGNOSIS — Z23 Encounter for immunization: Secondary | ICD-10-CM | POA: Diagnosis not present

## 2019-03-28 LAB — POCT URINALYSIS DIPSTICK
Blood, UA: NEGATIVE
Glucose, UA: NEGATIVE
Ketones, UA: NEGATIVE
Leukocytes, UA: NEGATIVE
Nitrite, UA: NEGATIVE
Protein, UA: NEGATIVE
Spec Grav, UA: <=1.005 — AB (ref 1.010–1.025)
Urobilinogen, UA: 2 E.U./dL — AB
pH, UA: 7.5 (ref 5.0–8.0)

## 2019-03-28 NOTE — Progress Notes (Signed)
Patient: Cynthia Blankenship, Female    DOB: 1961-05-29, 58 y.o.   MRN: [id number] Visit Date: 03/28/2019  Today's Provider: Wilhemena Durie, MD   Chief Complaint  Patient presents with  . Annual Exam   Subjective:    Annual physical exam Cynthia Blankenship is a 58 y.o. female who presents today for health maintenance and complete physical. She feels well. She reports exercising by walking . She reports she is sleeping fairly well. Pt feels well. ----------------------------------------------------------------- Last Reported Pap- 7/5/116 (HPV screen negative) Mammogram- 05/09/17 Bi-rads 1 Negative Colonoscopy- Due Tdap-11/27/2007     Review of Systems  Constitutional: Negative.   HENT: Negative.   Eyes: Negative.   Respiratory: Negative.   Cardiovascular: Negative.   Gastrointestinal: Negative.   Endocrine: Negative.   Genitourinary: Negative.   Musculoskeletal: Negative.   Allergic/Immunologic: Negative.   Neurological: Negative.   Hematological: Negative.   Psychiatric/Behavioral: Negative.   All other systems reviewed and are negative.   Social History She  reports that she has quit smoking. She has never used smokeless tobacco. She reports that she does not drink alcohol or use drugs. Social History   Socioeconomic History  . Marital status: Married    Spouse name: Not on file  . Number of children: Not on file  . Years of education: Not on file  . Highest education level: Not on file  Occupational History  . Not on file  Social Needs  . Financial resource strain: Not on file  . Food insecurity    Worry: Not on file    Inability: Not on file  . Transportation needs    Medical: Not on file    Non-medical: Not on file  Tobacco Use  . Smoking status: Former Research scientist (life sciences)  . Smokeless tobacco: Never Used  Substance and Sexual Activity  . Alcohol use: No    Alcohol/week: 0.0 standard drinks  . Drug use: No  . Sexual activity: Not on file  Lifestyle   . Physical activity    Days per week: Not on file    Minutes per session: Not on file  . Stress: Not on file  Relationships  . Social Herbalist on phone: Not on file    Gets together: Not on file    Attends religious service: Not on file    Active member of club or organization: Not on file    Attends meetings of clubs or organizations: Not on file    Relationship status: Not on file  Other Topics Concern  . Not on file  Social History Narrative  . Not on file    Patient Active Problem List   Diagnosis Date Noted  . Allergic rhinitis 06/02/2015  . Absolute anemia 01/17/2015  . 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 Adult hypothyroidism 01/17/2015  . Leiomyoma of uterus 01/17/2015  . Avitaminosis D 01/17/2015  . 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 ABDOMINAL HYSTERECTOMY    . TUBAL LIGATION      Family History  Family Status  Relation Name Status  . Mother  Deceased  . Father  Deceased  . Sister  Alive  . Brother  Alive  . Brother  Alive  . Sister  Alive  . Sister  Alive  . Neg Hx  (Not Specified)   Her family history includes Alcohol abuse in her father; Cirrhosis in her father; Hypertension in her mother.  No Known Allergies  Previous Medications   AMOXICILLIN-CLAVULANATE (AUGMENTIN) 875-125 MG TABLET    Take 1 tablet by mouth 2 (two) times daily.   CETIRIZINE (ZYRTEC) 10 MG TABLET    Take 1 tablet (10 mg total) by mouth daily.   CHLORPHENIRAMINE-HYDROCODONE (TUSSIONEX PENNKINETIC ER) 10-8 MG/5ML SUER    Take 5 mLs by mouth every 12 (twelve) hours as needed for cough.   DIPHENHYDRAMINE (BENADRYL) 12.5 MG/5ML LIQUID    Take 12.5 mg by mouth daily as needed for allergies.   LEVOTHYROXINE (SYNTHROID, LEVOTHROID) 50 MCG TABLET    Take 1 tablet (50 mcg total) by mouth daily.   NAPROXEN (NAPROSYN) 375 MG TABLET    Take 1 tablet (375 mg total) by mouth 2 (two) times daily with a meal.    Patient Care Team:  Jerrol Banana., MD as PCP - General (Family Medicine)      Objective:   Vitals: There were no vitals taken for this visit.   Physical Exam Vitals signs reviewed.  Constitutional:      Appearance: Normal appearance. She is well-developed.  HENT:     Head: Normocephalic and atraumatic.     Right Ear: Tympanic membrane and external ear normal.     Left Ear: Tympanic membrane and external ear normal.     Nose: Nose normal.     Mouth/Throat:     Pharynx: Oropharynx is clear.  Eyes:     General: No scleral icterus.    Conjunctiva/sclera: Conjunctivae normal.  Neck:     Thyroid: No thyromegaly.  Cardiovascular:     Rate and Rhythm: Normal rate and regular rhythm.     Heart sounds: Normal heart sounds.  Pulmonary:     Effort: Pulmonary effort is normal.     Breath sounds: Normal breath sounds.  Chest:     Breasts:        Right: Normal.        Left: Normal.  Abdominal:     Palpations: Abdomen is soft.  Lymphadenopathy:     Cervical: No cervical adenopathy.  Skin:    General: Skin is warm and dry.  Neurological:     General: No focal deficit present.     Mental Status: She is alert and oriented to person, place, and time. Mental status is at baseline.  Psychiatric:        Mood and Affect: Mood normal.        Behavior: Behavior normal.        Thought Content: Thought content normal.        Judgment: Judgment normal.      Depression Screen PHQ 2/9 Scores 03/22/2018 03/22/2017 03/18/2016 03/18/2015  PHQ - 2 Score 0 0 0 0  PHQ- 9 Score 0 0 - -      Assessment & Plan:     Routine Health Maintenance and Physical Exam  Exercise Activities and Dietary recommendations Goals   None     Immunization History  Administered Date(s) Administered  . Influenza-Unspecified 07/14/2015  . Tdap 11/27/2007    Health Maintenance  Topic Date Due  . HIV Screening  01/21/1976  . COLONOSCOPY  01/21/2011  . TETANUS/TDAP  11/26/2017  . PAP SMEAR-Modifier  03/17/2018  .  INFLUENZA VACCINE  04/14/2019  . MAMMOGRAM  05/11/2020  . Hepatitis C Screening  Completed     Discussed health benefits of physical activity, and encouraged her to engage in regular exercise appropriate for her age and condition.    --------------------------------------------------------------------  I,Kathleen J Wolford,acting as a Education administrator for [employer], MD.,have documented all relevant documentation on the behalf of Wilhemena Durie, MD,as directed by  Wilhemena Durie, MD while in the presence of Wilhemena Durie, MD. I have done the exam and reviewed the chart and it is accurate to the best of my knowledge. Development worker, community has been used and  any errors in dictation or transcription are unintentional. Miguel Aschoff M.D. [facility]

## 2019-03-29 LAB — CBC WITH DIFFERENTIAL/PLATELET
Basophils Absolute: 0 10*3/uL (ref 0.0–0.2)
Basos: 1 %
EOS (ABSOLUTE): 0.1 10*3/uL (ref 0.0–0.4)
Eos: 2 %
Hematocrit: 38.1 % (ref 34.0–46.6)
Hemoglobin: 12.9 g/dL (ref 11.1–15.9)
Immature Grans (Abs): 0 10*3/uL (ref 0.0–0.1)
Immature Granulocytes: 0 %
Lymphocytes Absolute: 1.9 10*3/uL (ref 0.7–3.1)
Lymphs: 53 %
MCH: 29.7 pg (ref 26.6–33.0)
MCHC: 33.9 g/dL (ref 31.5–35.7)
MCV: 88 fL (ref 79–97)
Monocytes Absolute: 0.3 10*3/uL (ref 0.1–0.9)
Monocytes: 10 %
Neutrophils Absolute: 1.2 10*3/uL — ABNORMAL LOW (ref 1.4–7.0)
Neutrophils: 34 %
Platelets: 240 10*3/uL (ref 150–450)
RBC: 4.35 x10E6/uL (ref 3.77–5.28)
RDW: 14.2 % (ref 11.7–15.4)
WBC: 3.6 10*3/uL (ref 3.4–10.8)

## 2019-03-29 LAB — COMPREHENSIVE METABOLIC PANEL
ALT: 30 IU/L (ref 0–32)
AST: 24 IU/L (ref 0–40)
Albumin/Globulin Ratio: 1.4 (ref 1.2–2.2)
Albumin: 4.4 g/dL (ref 3.8–4.9)
Alkaline Phosphatase: 74 IU/L (ref 39–117)
BUN/Creatinine Ratio: 16 (ref 9–23)
BUN: 12 mg/dL (ref 6–24)
Bilirubin Total: 0.4 mg/dL (ref 0.0–1.2)
CO2: 23 mmol/L (ref 20–29)
Calcium: 9.8 mg/dL (ref 8.7–10.2)
Chloride: 103 mmol/L (ref 96–106)
Creatinine, Ser: 0.75 mg/dL (ref 0.57–1.00)
GFR calc Af Amer: 102 mL/min/{1.73_m2} (ref >59)
GFR calc non Af Amer: 88 mL/min/{1.73_m2} (ref >59)
Globulin, Total: 3.2 g/dL (ref 1.5–4.5)
Glucose: 100 mg/dL — ABNORMAL HIGH (ref 65–99)
Potassium: 4.4 mmol/L (ref 3.5–5.2)
Sodium: 140 mmol/L (ref 134–144)
Total Protein: 7.6 g/dL (ref 6.0–8.5)

## 2019-03-29 LAB — LIPID PANEL
Chol/HDL Ratio: 3.2 ratio (ref 0.0–4.4)
Cholesterol, Total: 201 mg/dL — ABNORMAL HIGH (ref 100–199)
HDL: 62 mg/dL (ref >39)
LDL Calculated: 128 mg/dL — ABNORMAL HIGH (ref 0–99)
Triglycerides: 57 mg/dL (ref 0–149)
VLDL Cholesterol Cal: 11 mg/dL (ref 5–40)

## 2019-03-29 LAB — VITAMIN B12: Vitamin B-12: 187 pg/mL — ABNORMAL LOW (ref 232–1245)

## 2019-03-29 LAB — TSH: TSH: 10.76 u[IU]/mL — ABNORMAL HIGH (ref 0.450–4.500)

## 2019-04-02 ENCOUNTER — Telehealth: Payer: Self-pay

## 2019-04-02 NOTE — Telephone Encounter (Signed)
-----   Message from Jerrol Banana., MD sent at 04/02/2019  3:16 PM EDT ----- I do not see b12 deficiency in her diagnosis. Would therefore start B12 shot weekly times 4 then monthly at least till end of year then can try PO.

## 2019-04-02 NOTE — Telephone Encounter (Signed)
Patient states that she has a past medical history of of b12 deficiency and that you had stopped on medication back in March. Patient reports that she had previous problem with pain in her arm and hand and symptoms resolved once you took her off B12.

## 2019-04-02 NOTE — Telephone Encounter (Signed)
Then do B12 OTC--1 pill daily

## 2019-04-03 ENCOUNTER — Telehealth: Payer: Self-pay | Admitting: Family Medicine

## 2019-04-03 NOTE — Telephone Encounter (Signed)
Patient was advised via voicemail per DPR. 

## 2019-04-03 NOTE — Telephone Encounter (Signed)
Thank you :)

## 2019-04-03 NOTE — Telephone Encounter (Signed)
Pt was advised that she can start taking the B12 OTC.    I tried to get a nurse several times and pt had already called back so I told her what DR. Rosanna Randy recommended,  Con Memos

## 2019-05-14 ENCOUNTER — Ambulatory Visit
Admission: RE | Admit: 2019-05-14 | Discharge: 2019-05-14 | Disposition: A | Discharge status: Home / Self Care | Payer: 59 | Source: Ambulatory Visit | Attending: Family Medicine | Admitting: Family Medicine

## 2019-05-14 DIAGNOSIS — Z1231 Encounter for screening mammogram for malignant neoplasm of breast: Secondary | ICD-10-CM | POA: Insufficient documentation

## 2019-06-19 ENCOUNTER — Telehealth: Payer: Self-pay | Admitting: Family Medicine

## 2019-06-19 NOTE — Telephone Encounter (Signed)
Appointment scheduled.

## 2019-06-19 NOTE — Telephone Encounter (Signed)
Pt called saying since she ate out on Sunday she has stomach discomfort.  Please advise [phone]  Con Memos

## 2019-06-20 ENCOUNTER — Encounter: Payer: Self-pay | Admitting: Family Medicine

## 2019-06-20 ENCOUNTER — Other Ambulatory Visit: Payer: Self-pay

## 2019-06-20 ENCOUNTER — Ambulatory Visit: Payer: 59 | Admitting: Family Medicine

## 2019-06-20 VITALS — BP 148/80 | HR 77 | Temp 97.3°F | Resp 16 | Wt 141.0 lb

## 2019-06-20 DIAGNOSIS — F410 Panic disorder [episodic paroxysmal anxiety] without agoraphobia: Secondary | ICD-10-CM

## 2019-06-20 MED ORDER — ALPRAZOLAM 0.25 MG PO TABS: 0.2500 mg | ORAL_TABLET | Freq: Three times a day (TID) | ORAL | 1 refills | Status: DC | PRN

## 2019-06-20 NOTE — Progress Notes (Signed)
Patient: Cynthia Blankenship Female    DOB: March 19, 1961   58 y.o.   MRN: [id number] Visit Date: 06/20/2019  Today's Provider: Wilhemena Durie, MD   Chief Complaint  Patient presents with  . Anxiety   Subjective:     HPI Anxiousness: Patient complains of feeling nervous or anxious. Symptoms started 3 days ago and seem to be improving. Patient also complains of nausea. Dealing with Covid and social issues and recent death of her 62 yo nephew.  No Known Allergies   Current Outpatient Medications:  .  levothyroxine (SYNTHROID, LEVOTHROID) 50 MCG tablet, Take 1 tablet (50 mcg total) by mouth daily., Disp: 30 tablet, Rfl: 12 .  Multiple Vitamin (MULTIVITAMIN) tablet, Take 1 tablet by mouth daily., Disp: , Rfl:   Review of Systems  Constitutional: Negative for appetite change, chills, fatigue and fever.  Respiratory: Negative for chest tightness and shortness of breath.   Cardiovascular: Negative for chest pain and palpitations.  Gastrointestinal: Negative for abdominal pain, nausea and vomiting.  Allergic/Immunologic: Negative.   Neurological: Negative for dizziness and weakness.  Hematological: Negative.   Psychiatric/Behavioral: The patient is nervous/anxious.     Social History   Tobacco Use  . Smoking status: Former Research scientist (life sciences)  . Smokeless tobacco: Never Used  Substance Use Topics  . Alcohol use: No    Alcohol/week: 0.0 standard drinks      Objective:   BP (!) 148/80 (BP Location: Right Arm, Patient Position: Sitting, Cuff Size: Large)   Pulse 77   Temp (!) 97.3 F (36.3 C) (Temporal)   Resp 16   Wt 141 lb (64 kg)   SpO2 98% Comment: room air  BMI 27.08 kg/m  Vitals:   06/20/19 1617 06/20/19 1620  BP: (!) 142/80 (!) 148/80  Pulse: 77   Resp: 16   Temp: (!) 97.3 F (36.3 C)   TempSrc: Temporal   SpO2: 98%   Weight: 141 lb (64 kg)   Body mass index is 27.08 kg/m.   Physical Exam Vitals signs reviewed.  Constitutional:      Appearance: Normal  appearance. She is well-developed.  HENT:     Head: Normocephalic and atraumatic.     Right Ear: Tympanic membrane and external ear normal.     Left Ear: Tympanic membrane and external ear normal.     Nose: Nose normal.     Mouth/Throat:     Pharynx: Oropharynx is clear.  Eyes:     General: No scleral icterus.    Conjunctiva/sclera: Conjunctivae normal.  Neck:     Thyroid: No thyromegaly.  Cardiovascular:     Rate and Rhythm: Normal rate and regular rhythm.     Heart sounds: Normal heart sounds.  Pulmonary:     Effort: Pulmonary effort is normal.     Breath sounds: Normal breath sounds.  Chest:     Breasts:        Right: Normal.        Left: Normal.  Abdominal:     Palpations: Abdomen is soft.  Lymphadenopathy:     Cervical: No cervical adenopathy.  Skin:    General: Skin is warm and dry.  Neurological:     General: No focal deficit present.     Mental Status: She is alert and oriented to person, place, and time. Mental status is at baseline.  Psychiatric:        Mood and Affect: Mood normal.        Behavior: Behavior  normal.        Thought Content: Thought content normal.        Judgment: Judgment normal.      No results found for any visits on 06/20/19.     Assessment & Plan    1. Anxiety attack Try low dose Xanax --rTC 2-3 weeks. Consider sertraline or hydroxyzine.     Trevyn Lumpkin Cranford Mon, MD  [facility]

## 2019-07-23 ENCOUNTER — Ambulatory Visit: Payer: Self-pay | Admitting: Family Medicine

## 2019-08-14 ENCOUNTER — Ambulatory Visit (INDEPENDENT_AMBULATORY_CARE_PROVIDER_SITE_OTHER): Payer: 59 | Admitting: Family Medicine

## 2019-08-14 ENCOUNTER — Encounter: Payer: Self-pay | Admitting: Family Medicine

## 2019-08-14 ENCOUNTER — Other Ambulatory Visit: Payer: Self-pay

## 2019-08-14 VITALS — BP 182/92 | HR 108 | Temp 96.8°F | Resp 18 | Ht 61.0 in | Wt 144.0 lb

## 2019-08-14 DIAGNOSIS — F410 Panic disorder [episodic paroxysmal anxiety] without agoraphobia: Secondary | ICD-10-CM

## 2019-08-14 DIAGNOSIS — E039 Hypothyroidism, unspecified: Secondary | ICD-10-CM | POA: Diagnosis not present

## 2019-08-14 DIAGNOSIS — I10 Essential (primary) hypertension: Secondary | ICD-10-CM | POA: Diagnosis not present

## 2019-08-14 DIAGNOSIS — D649 Anemia, unspecified: Secondary | ICD-10-CM

## 2019-08-14 MED ORDER — ATENOLOL 25 MG PO TABS: 25.0000 mg | ORAL_TABLET | Freq: Every day | ORAL | 3 refills | Status: DC
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
## 2019-08-17 ENCOUNTER — Telehealth: Payer: Self-pay

## 2019-08-17 DIAGNOSIS — E039 Hypothyroidism, unspecified: Secondary | ICD-10-CM

## 2019-08-17 LAB — COMPREHENSIVE METABOLIC PANEL
ALT: 13 IU/L (ref 0–32)
AST: 16 IU/L (ref 0–40)
Albumin/Globulin Ratio: 1.4 (ref 1.2–2.2)
Albumin: 4.4 g/dL (ref 3.8–4.9)
Alkaline Phosphatase: 81 IU/L (ref 39–117)
BUN/Creatinine Ratio: 13 (ref 9–23)
BUN: 10 mg/dL (ref 6–24)
Bilirubin Total: 0.4 mg/dL (ref 0.0–1.2)
CO2: 21 mmol/L (ref 20–29)
Calcium: 10.1 mg/dL (ref 8.7–10.2)
Chloride: 104 mmol/L (ref 96–106)
Creatinine, Ser: 0.8 mg/dL (ref 0.57–1.00)
GFR calc Af Amer: 94 mL/min/{1.73_m2} (ref >59)
GFR calc non Af Amer: 82 mL/min/{1.73_m2} (ref >59)
Globulin, Total: 3.1 g/dL (ref 1.5–4.5)
Glucose: 88 mg/dL (ref 65–99)
Potassium: 4 mmol/L (ref 3.5–5.2)
Sodium: 140 mmol/L (ref 134–144)
Total Protein: 7.5 g/dL (ref 6.0–8.5)

## 2019-08-17 LAB — CBC WITH DIFFERENTIAL/PLATELET
Basophils Absolute: 0.1 10*3/uL (ref 0.0–0.2)
Basos: 1 %
EOS (ABSOLUTE): 0.1 10*3/uL (ref 0.0–0.4)
Eos: 1 %
Hematocrit: 39.7 % (ref 34.0–46.6)
Hemoglobin: 13.2 g/dL (ref 11.1–15.9)
Immature Grans (Abs): 0 10*3/uL (ref 0.0–0.1)
Immature Granulocytes: 0 %
Lymphocytes Absolute: 3 10*3/uL (ref 0.7–3.1)
Lymphs: 51 %
MCH: 29 pg (ref 26.6–33.0)
MCHC: 33.2 g/dL (ref 31.5–35.7)
MCV: 87 fL (ref 79–97)
Monocytes Absolute: 0.5 10*3/uL (ref 0.1–0.9)
Monocytes: 8 %
Neutrophils Absolute: 2.3 10*3/uL (ref 1.4–7.0)
Neutrophils: 39 %
Platelets: 291 10*3/uL (ref 150–450)
RBC: 4.55 x10E6/uL (ref 3.77–5.28)
RDW: 12.8 % (ref 11.7–15.4)
WBC: 5.9 10*3/uL (ref 3.4–10.8)

## 2019-08-17 LAB — TSH: TSH: 26.8 u[IU]/mL — ABNORMAL HIGH (ref 0.450–4.500)

## 2019-08-17 MED ORDER — LEVOTHYROXINE SODIUM 100 MCG PO TABS: 100.0000 ug | ORAL_TABLET | Freq: Every day | ORAL | 3 refills | Status: DC

## 2019-08-17 NOTE — Telephone Encounter (Signed)
Patient has been taking 50 mcg. Of Synthroid.  I told her Dr. Rosanna Randy instructed her to take 100 mcg. And that it had been sent to [facility].  cbe

## 2019-08-17 NOTE — Telephone Encounter (Signed)
-----   Message from Jerrol Banana., MD sent at 08/17/2019  7:17 AM EST ----- Labs okay but thyroid low.  Is patient taking Synthroid?  She is not would take the 50 mcg daily.  If she is taking the 50 mcg daily will increase to 100 mcg daily

## 2019-08-17 NOTE — Telephone Encounter (Signed)
LVM for pt to call clinic for results and prescription change

## 2019-09-20 ENCOUNTER — Other Ambulatory Visit: Payer: Self-pay

## 2019-09-20 ENCOUNTER — Ambulatory Visit: Payer: 59 | Admitting: Family Medicine

## 2019-09-20 VITALS — BP 160/64 | HR 94 | Temp 97.3°F | Resp 18 | Ht 61.0 in | Wt 138.6 lb

## 2019-09-20 DIAGNOSIS — E039 Hypothyroidism, unspecified: Secondary | ICD-10-CM | POA: Diagnosis not present

## 2019-09-20 DIAGNOSIS — F410 Panic disorder [episodic paroxysmal anxiety] without agoraphobia: Secondary | ICD-10-CM

## 2019-09-20 DIAGNOSIS — I10 Essential (primary) hypertension: Secondary | ICD-10-CM | POA: Diagnosis not present

## 2019-09-20 NOTE — Progress Notes (Signed)
Patient: Cynthia Blankenship Female    DOB: [birth year]   59 y.o.   MRN: [id number] Visit Date: 09/20/2019  Today's Provider: Wilhemena Durie, MD   Chief Complaint  Patient presents with  . Follow-up  . Hypertension  . Anxiety   Subjective:     Pt is feeling much better from her past few visits.  Hypertension This is a chronic problem. The problem is unchanged. The problem is controlled. Associated symptoms include anxiety. Pertinent negatives include no chest pain, palpitations or shortness of breath. The current treatment provides mild improvement. There are no compliance problems.  Identifiable causes of hypertension include a thyroid problem.     Anxiety attack From 08/14/2019-Patient wishes to continue with counseling.  No medications.  I am fine with this.  Hypertension, unspecified type From 08/14/2019-Follow-up blood pressures at home with readings.  Recheck first of the year.  Altenolol 25mg  will be perfect for her as it might help her nerves a little  Adult hypothyroidism From 08/14/2019-labs checked. Increased levothyroxine from 50 mcg qd to 100 mcg qd.   No Known Allergies   Current Outpatient Medications:  .  ALPRAZolam (XANAX) 0.25 MG tablet, Take 1 tablet (0.25 mg total) by mouth 3 (three) times daily as needed for anxiety. (Patient not taking: Reported on 08/14/2019), Disp: 35 tablet, Rfl: 1 .  atenolol (TENORMIN) 25 MG tablet, Take 1 tablet (25 mg total) by mouth daily., Disp: 90 tablet, Rfl: 3 .  levothyroxine (SYNTHROID) 100 MCG tablet, Take 1 tablet (100 mcg total) by mouth daily., Disp: 90 tablet, Rfl: 3 .  Multiple Vitamin (MULTIVITAMIN) tablet, Take 1 tablet by mouth daily., Disp: , Rfl:   Review of Systems  Constitutional: Negative for appetite change, chills, fatigue and fever.  HENT: Negative.   Eyes: Negative.   Respiratory: Negative for chest tightness and shortness of breath.   Cardiovascular: Negative for chest pain and palpitations.    Gastrointestinal: Negative for abdominal pain, nausea and vomiting.  Endocrine: Negative.   Genitourinary: Negative.   Musculoskeletal: Negative.   Skin: Negative.   Allergic/Immunologic: Negative.   Neurological: Negative for dizziness and weakness.  Hematological: Negative.   Psychiatric/Behavioral: The patient is nervous/anxious.     Social History   Tobacco Use  . Smoking status: Former Research scientist (life sciences)  . Smokeless tobacco: Never Used  Substance Use Topics  . Alcohol use: No    Alcohol/week: 0.0 standard drinks      Objective:   BP (!) 170/60   Pulse 94   Temp (!) 97.3 F (36.3 C) (Temporal)   Resp 18   Ht 5\' 1"  (1.549 m)   Wt 138 lb 9.6 oz (62.9 kg)   SpO2 100%   BMI 26.19 kg/m  Vitals:   09/20/19 1608  BP: (!) 170/60  Pulse: 94  Resp: 18  Temp: (!) 97.3 F (36.3 C)  TempSrc: Temporal  SpO2: 100%  Weight: 138 lb 9.6 oz (62.9 kg)  Height: 5\' 1"  (1.549 m)  Body mass index is 26.19 kg/m.   Physical Exam Vitals reviewed.  Constitutional:      Appearance: Normal appearance. She is well-developed.  HENT:     Head: Normocephalic and atraumatic.     Right Ear: Tympanic membrane and external ear normal.     Left Ear: Tympanic membrane and external ear normal.     Nose: Nose normal.     Mouth/Throat:     Pharynx: Oropharynx is clear.  Eyes:  General: No scleral icterus.    Conjunctiva/sclera: Conjunctivae normal.  Neck:     Thyroid: No thyromegaly.  Cardiovascular:     Rate and Rhythm: Normal rate and regular rhythm.     Heart sounds: Normal heart sounds.  Pulmonary:     Effort: Pulmonary effort is normal.     Breath sounds: Normal breath sounds.  Chest:     Breasts:        Right: Normal.        Left: Normal.  Abdominal:     Palpations: Abdomen is soft.  Lymphadenopathy:     Cervical: No cervical adenopathy.  Skin:    General: Skin is warm and dry.  Neurological:     General: No focal deficit present.     Mental Status: She is alert and  oriented to person, place, and time. Mental status is at baseline.  Psychiatric:        Mood and Affect: Mood normal.        Behavior: Behavior normal.        Thought Content: Thought content normal.        Judgment: Judgment normal.      No results found for any visits on 09/20/19.     Assessment & Plan    1. Adult hypothyroidism No change  2. Anxiety attack No Change More than 50% 25 minute visit spent in counseling or coordination of care  3. Hypertension, unspecified type No Change    Wilhemena Durie, MD  [facility]

## 2019-12-14 NOTE — Progress Notes (Signed)
Patient: Cynthia Blankenship Female    DOB: 08-04-1961   59 y.o.   MRN: [id number] Visit Date: 12/24/2019  Today's Provider: Wilhemena Durie, MD   Chief Complaint  Patient presents with   Anxiety   Subjective:     HPI  Is drinking but notes she cannot control it.  He is not abusive nor next or feels unsafe. Adult hypothyroidism From 09/20/2019-No changes.  Anxiety attack From 09/20/2019-No changes.  Hypertension, unspecified type From 09/20/2019-No changes.   No Known Allergies   Current Outpatient Medications:    atenolol (TENORMIN) 25 MG tablet, Take 1 tablet (25 mg total) by mouth daily., Disp: 90 tablet, Rfl: 3   levothyroxine (SYNTHROID) 100 MCG tablet, Take 1 tablet (100 mcg total) by mouth daily., Disp: 90 tablet, Rfl: 3   Multiple Vitamin (MULTIVITAMIN) tablet, Take 1 tablet by mouth daily., Disp: , Rfl:    ALPRAZolam (XANAX) 0.25 MG tablet, Take 1 tablet (0.25 mg total) by mouth 3 (three) times daily as needed for anxiety. (Patient not taking: Reported on 08/14/2019), Disp: 35 tablet, Rfl: 1  Review of Systems  Constitutional: Negative for appetite change, chills, fatigue and fever.  HENT: Negative.   Eyes: Negative.   Respiratory: Negative for chest tightness and shortness of breath.   Cardiovascular: Negative for chest pain and palpitations.  Gastrointestinal: Negative for abdominal pain, nausea and vomiting.  Endocrine: Negative.   Allergic/Immunologic: Negative.   Neurological: Negative for dizziness and weakness.  Psychiatric/Behavioral: Negative.     Social History   Tobacco Use   Smoking status: Former Smoker   Smokeless tobacco: Never Used  Substance Use Topics   Alcohol use: No    Alcohol/week: 0.0 standard drinks      Objective:   BP (!) 168/80 (BP Location: Right Arm, Patient Position: Sitting, Cuff Size: Normal)    Pulse 87    Temp (!) 97.5 F (36.4 C) (Temporal)    Ht 5\' 1"  (1.549 m)    Wt 133 lb 12.8 oz (60.7 kg)    BMI  25.28 kg/m  Vitals:   12/24/19 1607  BP: (!) 168/80  Pulse: 87  Temp: (!) 97.5 F (36.4 C)  TempSrc: Temporal  Weight: 133 lb 12.8 oz (60.7 kg)  Height: 5\' 1"  (1.549 m)  Body mass index is 25.28 kg/m.   Physical Exam Vitals reviewed.  Constitutional:      Appearance: Normal appearance. She is well-developed.  HENT:     Head: Normocephalic and atraumatic.     Right Ear: Tympanic membrane and external ear normal.     Left Ear: Tympanic membrane and external ear normal.     Nose: Nose normal.     Mouth/Throat:     Pharynx: Oropharynx is clear.  Eyes:     General: No scleral icterus.    Conjunctiva/sclera: Conjunctivae normal.  Neck:     Thyroid: No thyromegaly.  Cardiovascular:     Rate and Rhythm: Normal rate and regular rhythm.     Heart sounds: Normal heart sounds.  Pulmonary:     Effort: Pulmonary effort is normal.     Breath sounds: Normal breath sounds.  Chest:     Breasts:        Right: Normal.        Left: Normal.  Abdominal:     Palpations: Abdomen is soft.  Lymphadenopathy:     Cervical: No cervical adenopathy.  Skin:    General: Skin is warm and dry.  Neurological:     General: No focal deficit present.     Mental Status: She is alert and oriented to person, place, and time. Mental status is at baseline.  Psychiatric:        Mood and Affect: Mood normal.        Behavior: Behavior normal.        Thought Content: Thought content normal.        Judgment: Judgment normal.      No results found for any visits on 12/24/19.     Assessment & Plan    1. Adult hypothyroidism Think the patient is probably hyperthyroid with overtreatment.  Check TSH and follow-up in 2 to 3 months. - TSH  2. Anxiety attack On the next visit.  I still think an SSRI might be helpful.  She is doing better  3. Avitaminosis D      Wilhemena Durie, MD  [facility]

## 2019-12-20 ENCOUNTER — Ambulatory Visit: Payer: Self-pay | Admitting: Family Medicine

## 2019-12-24 ENCOUNTER — Other Ambulatory Visit: Payer: Self-pay

## 2019-12-24 ENCOUNTER — Ambulatory Visit: Payer: 59 | Admitting: Family Medicine

## 2019-12-24 ENCOUNTER — Encounter: Payer: Self-pay | Admitting: Family Medicine

## 2019-12-24 VITALS — BP 169/80 | HR 87 | Temp 97.5°F | Ht 61.0 in | Wt 133.8 lb

## 2019-12-24 DIAGNOSIS — F410 Panic disorder [episodic paroxysmal anxiety] without agoraphobia: Secondary | ICD-10-CM | POA: Diagnosis not present

## 2019-12-24 DIAGNOSIS — E039 Hypothyroidism, unspecified: Secondary | ICD-10-CM | POA: Diagnosis not present

## 2019-12-24 DIAGNOSIS — E559 Vitamin D deficiency, unspecified: Secondary | ICD-10-CM | POA: Diagnosis not present

## 2019-12-24 NOTE — Patient Instructions (Signed)
Hold Synthroid 100mg  until next visit!!!

## 2019-12-26 ENCOUNTER — Telehealth: Payer: Self-pay

## 2019-12-26 DIAGNOSIS — E039 Hypothyroidism, unspecified: Secondary | ICD-10-CM

## 2019-12-26 LAB — TSH: TSH: 0.016 u[IU]/mL — ABNORMAL LOW (ref 0.450–4.500)

## 2019-12-26 NOTE — Telephone Encounter (Signed)
Please note lab result from this week.  Thanks   Copied from [place] [id number]. Topic: General - Other >> Dec 26, 2019  3:51 PM Leward Quan A wrote: Reason for CRM: Patient called to ask if Dr Rosanna Randy can send the Rx to the pharmacy for her levothyroxine (SYNTHROID) 50 MCG tablet. [address] [place], [place]  Phone:  [phone] Fax:  [phone]

## 2019-12-26 NOTE — Telephone Encounter (Signed)
Left patient detailed voicemail and let her know to call back if she has any questions.

## 2019-12-26 NOTE — Telephone Encounter (Signed)
Called to advise patient on lab, [place].

## 2019-12-26 NOTE — Telephone Encounter (Signed)
Patient called back to say that she is at work and ask if Cynthia Blankenship can leave her a detailed message upon calling back. Ph# [phone]

## 2019-12-26 NOTE — Telephone Encounter (Signed)
-----   Message from Jerrol Banana., MD sent at 12/26/2019  9:18 AM EDT ----- Cut Synthroid dose from 100 mcg to 50 mcg daily.

## 2019-12-27 MED ORDER — LEVOTHYROXINE SODIUM 50 MCG PO TABS: 50.0000 ug | ORAL_TABLET | Freq: Every day | ORAL | 3 refills | Status: DC

## 2019-12-27 NOTE — Telephone Encounter (Signed)
Please do this--thx

## 2019-12-27 NOTE — Addendum Note (Signed)
Addended by: Gerald Stabs on: 12/27/2019 08:43 AM   Modules accepted: Orders

## 2020-01-17 NOTE — Progress Notes (Signed)
Established patient visit   Patient: Cynthia Blankenship   DOB: 1961-08-02   59 y.o. Female  MRN: [id number] Visit Date: 01/23/2020  I,Sulibeya S Dimas,acting as a scribe for Wilhemena Durie, MD.,have documented all relevant documentation on the behalf of Wilhemena Durie, MD,as directed by  Wilhemena Durie, MD while in the presence of Wilhemena Durie, MD.  Today's healthcare provider: Wilhemena Durie, MD   Chief Complaint  Patient presents with  . Hypertension   Subjective    HPI  She feels much better since her Synthroid dose was decreased.  Feels back to normal.  Also less anxious. Hypothyroid, follow-up  Lab Results  Component Value Date   TSH 0.016 (L) 12/25/2019   TSH 26.800 (H) 08/16/2019   TSH 10.760 (H) 03/28/2019   Wt Readings from Last 3 Encounters:  01/23/20 132 lb 6.4 oz (60.1 kg)  12/24/19 133 lb 12.8 oz (60.7 kg)  09/20/19 138 lb 9.6 oz (62.9 kg)    She was last seen for hypothyroid 1 months ago.  Management since that visit includes decrease Synthroid to 50 mg daily. She reports excellent compliance with treatment. She is not having side effects.   Symptoms: No change in energy level No constipation No diarrhea No heat / cold intolerance No nervousness No palpitations No weight changes  ----------------------------------------------------------------------------------------- Hypertension, follow-up  BP Readings from Last 3 Encounters:  01/23/20 (!) 163/78  12/24/19 (!) 169/80  09/20/19 (!) 160/64   Wt Readings from Last 3 Encounters:  01/23/20 132 lb 6.4 oz (60.1 kg)  12/24/19 133 lb 12.8 oz (60.7 kg)  09/20/19 138 lb 9.6 oz (62.9 kg)     She was last seen for hypertension 1 months ago.  BP at that visit was 169/80. Management since that visit includes no changes.  She reports excellent compliance with treatment. She is not having side effects.  She is following a Regular diet. She is not exercising. She does not  smoke.  Use of agents associated with hypertension: none.   Outside blood pressures are not being checked. Symptoms: No chest pain No chest pressure  No palpitations No syncope  No dyspnea No orthopnea  No paroxysmal nocturnal dyspnea No lower extremity edema   Pertinent labs: Lab Results  Component Value Date   CHOL 201 (H) 03/28/2019   HDL 62 03/28/2019   LDLCALC 128 (H) 03/28/2019   TRIG 57 03/28/2019   CHOLHDL 3.2 03/28/2019   Lab Results  Component Value Date   NA 140 08/16/2019   K 4.0 08/16/2019   CREATININE 0.80 08/16/2019   GFRNONAA 82 08/16/2019   GFRAA 94 08/16/2019   GLUCOSE 88 08/16/2019     The 10-year ASCVD risk score Mikey Bussing DC Jr., et al., 2013) is: 12.1%   --------------------------------------------------------------------------------------------------- Follow up for Anxiety  The patient was last seen for this 1 months ago. Changes made at last visit include decrease Synthroid 50mg  daily.  She reports excellent compliance with treatment. She feels that condition is Improved. She is not having side effects.   -----------------------------------------------------------------------------------------   Medications: Outpatient Medications Prior to Visit  Medication Sig  . atenolol (TENORMIN) 25 MG tablet Take 1 tablet (25 mg total) by mouth daily.  Marland Kitchen levothyroxine (SYNTHROID) 50 MCG tablet Take 1 tablet (50 mcg total) by mouth daily.  . Multiple Vitamin (MULTIVITAMIN) tablet Take 1 tablet by mouth daily.  Marland Kitchen ALPRAZolam (XANAX) 0.25 MG tablet Take 1 tablet (0.25 mg total) by mouth 3 (three)  times daily as needed for anxiety. (Patient not taking: Reported on 08/14/2019)   No facility-administered medications prior to visit.    Review of Systems  Constitutional: Negative for appetite change, chills, fatigue and fever.  Respiratory: Negative for chest tightness and shortness of breath.   Cardiovascular: Negative for chest pain and palpitations.   Gastrointestinal: Negative for abdominal pain, nausea and vomiting.  Neurological: Negative for dizziness and weakness.    Last metabolic panel Lab Results  Component Value Date   GLUCOSE 88 08/16/2019   NA 140 08/16/2019   K 4.0 08/16/2019   CL 104 08/16/2019   CO2 21 08/16/2019   BUN 10 08/16/2019   CREATININE 0.80 08/16/2019   GFRNONAA 82 08/16/2019   GFRAA 94 08/16/2019   CALCIUM 10.1 08/16/2019   PROT 7.5 08/16/2019   ALBUMIN 4.4 08/16/2019   LABGLOB 3.1 08/16/2019   AGRATIO 1.4 08/16/2019   BILITOT 0.4 08/16/2019   ALKPHOS 81 08/16/2019   AST 16 08/16/2019   ALT 13 08/16/2019   Last thyroid functions Lab Results  Component Value Date   TSH 0.016 (L) 12/25/2019      Objective    BP (!) 163/78 (BP Location: Right Arm, Patient Position: Sitting, Cuff Size: Normal)   Pulse 78   Temp (!) 97.3 F (36.3 C) (Temporal)   Resp 16   Ht 5\' 1"  (1.549 m)   Wt 132 lb 6.4 oz (60.1 kg)   BMI 25.02 kg/m  BP Readings from Last 3 Encounters:  01/23/20 (!) 163/78  12/24/19 (!) 169/80  09/20/19 (!) 160/64   Wt Readings from Last 3 Encounters:  01/23/20 132 lb 6.4 oz (60.1 kg)  12/24/19 133 lb 12.8 oz (60.7 kg)  09/20/19 138 lb 9.6 oz (62.9 kg)      Physical Exam Vitals reviewed.  Constitutional:      Appearance: Normal appearance. She is well-developed.  HENT:     Head: Normocephalic and atraumatic.     Right Ear: Tympanic membrane and external ear normal.     Left Ear: Tympanic membrane and external ear normal.     Nose: Nose normal.     Mouth/Throat:     Pharynx: Oropharynx is clear.  Eyes:     General: No scleral icterus.    Conjunctiva/sclera: Conjunctivae normal.  Neck:     Thyroid: No thyromegaly.  Cardiovascular:     Rate and Rhythm: Normal rate and regular rhythm.     Heart sounds: Normal heart sounds.  Pulmonary:     Effort: Pulmonary effort is normal.     Breath sounds: Normal breath sounds.  Chest:     Breasts:        Right: Normal.         Left: Normal.  Abdominal:     Palpations: Abdomen is soft.  Lymphadenopathy:     Cervical: No cervical adenopathy.  Skin:    General: Skin is warm and dry.  Neurological:     General: No focal deficit present.     Mental Status: She is alert and oriented to person, place, and time. Mental status is at baseline.  Psychiatric:        Mood and Affect: Mood normal.        Behavior: Behavior normal.        Thought Content: Thought content normal.        Judgment: Judgment normal.       No results found for any visits on 01/23/20.  Assessment & Plan  1. Adult hypothyroidism TSH on next visit.  For routine care she also needs lipid panel and HIV on next visit  2. Essential hypertension Not controlled.  I think will improve as her thyroid regulates but at this time increase atenolol to 50 mg daily  3. Anxiety attack Improved on lower Synthroid dose.  She feels back to normal.  No follow-ups on file.      I, Wilhemena Durie, MD, have reviewed all documentation for this visit. The documentation on 01/23/20 for the exam, diagnosis, procedures, and orders are all accurate and complete.    Richard Cranford Mon, MD  [facility] [phone] (phone) [phone] (fax)  [place]

## 2020-01-23 ENCOUNTER — Telehealth: Payer: Self-pay

## 2020-01-23 ENCOUNTER — Other Ambulatory Visit: Payer: Self-pay

## 2020-01-23 ENCOUNTER — Ambulatory Visit: Payer: 59 | Admitting: Family Medicine

## 2020-01-23 VITALS — BP 163/78 | HR 78 | Temp 97.3°F | Resp 16 | Ht 61.0 in | Wt 132.4 lb

## 2020-01-23 DIAGNOSIS — F410 Panic disorder [episodic paroxysmal anxiety] without agoraphobia: Secondary | ICD-10-CM | POA: Diagnosis not present

## 2020-01-23 DIAGNOSIS — I10 Essential (primary) hypertension: Secondary | ICD-10-CM | POA: Diagnosis not present

## 2020-01-23 DIAGNOSIS — E039 Hypothyroidism, unspecified: Secondary | ICD-10-CM | POA: Diagnosis not present

## 2020-01-23 MED ORDER — ATENOLOL 50 MG PO TABS: 50.0000 mg | ORAL_TABLET | Freq: Every day | ORAL | 1 refills | Status: DC

## 2020-01-23 NOTE — Assessment & Plan Note (Signed)
Improved On decreased dose of Synthroid Follow up in 3 months

## 2020-01-23 NOTE — Patient Instructions (Signed)
Hypothyroidism  Hypothyroidism is when the thyroid gland does not make enough of certain hormones (it is underactive). The thyroid gland is a small gland located in the lower front part of the neck, just in front of the windpipe (trachea). This gland makes hormones that help control how the body uses food for energy (metabolism) as well as how the heart and brain function. These hormones also play a role in keeping your bones strong. When the thyroid is underactive, it produces too little of the hormones thyroxine (T4) and triiodothyronine (T3). What are the causes? This condition may be caused by:  Hashimoto's disease. This is a disease in which the body's disease-fighting system (immune system) attacks the thyroid gland. This is the most common cause.  Viral infections.  Pregnancy.  Certain medicines.  Birth defects.  Past radiation treatments to the head or neck for cancer.  Past treatment with radioactive iodine.  Past exposure to radiation in the environment.  Past surgical removal of part or all of the thyroid.  Problems with a gland in the center of the brain (pituitary gland).  Lack of enough iodine in the diet. What increases the risk? You are more likely to develop this condition if:  You are female.  You have a family history of thyroid conditions.  You use a medicine called lithium.  You take medicines that affect the immune system (immunosuppressants). What are the signs or symptoms? Symptoms of this condition include:  Feeling as though you have no energy (lethargy).  Not being able to tolerate cold.  Weight gain that is not explained by a change in diet or exercise habits.  Lack of appetite.  Dry skin.  Coarse hair.  Menstrual irregularity.  Slowing of thought processes.  Constipation.  Sadness or depression. How is this diagnosed? This condition may be diagnosed based on:  Your symptoms, your medical history, and a physical exam.  Blood  tests. You may also have imaging tests, such as an ultrasound or MRI. How is this treated? This condition is treated with medicine that replaces the thyroid hormones that your body does not make. After you begin treatment, it may take several weeks for symptoms to go away. Follow these instructions at home:  Take over-the-counter and prescription medicines only as told by your health care provider.  If you start taking any new medicines, tell your health care provider.  Keep all follow-up visits as told by your health care provider. This is important. ? As your condition improves, your dosage of thyroid hormone medicine may change. ? You will need to have blood tests regularly so that your health care provider can monitor your condition. Contact a health care provider if:  Your symptoms do not get better with treatment.  You are taking thyroid replacement medicine and you: ? Sweat a lot. ? Have tremors. ? Feel anxious. ? Lose weight rapidly. ? Cannot tolerate heat. ? Have emotional swings. ? Have diarrhea. ? Feel weak. Get help right away if you have:  Chest pain.  An irregular heartbeat.  A rapid heartbeat.  Difficulty breathing. Summary  Hypothyroidism is when the thyroid gland does not make enough of certain hormones (it is underactive).  When the thyroid is underactive, it produces too little of the hormones thyroxine (T4) and triiodothyronine (T3).  The most common cause is Hashimoto's disease, a disease in which the body's disease-fighting system (immune system) attacks the thyroid gland. The condition can also be caused by viral infections, medicine, pregnancy, or past   radiation treatment to the head or neck.  Symptoms may include weight gain, dry skin, constipation, feeling as though you do not have energy, and not being able to tolerate cold.  This condition is treated with medicine to replace the thyroid hormones that your body does not make. This information  is not intended to replace advice given to you by your health care provider. Make sure you discuss any questions you have with your health care provider. Document Revised: 08/12/2017 Document Reviewed: 08/10/2017 Elsevier Patient Education  2020 [employer]  

## 2020-01-23 NOTE — Assessment & Plan Note (Signed)
Previously uncontrolled Synthroid was decreased to 50mg  daily Continue Synthroid at current dose  Recheck TSH at next visit Follow up in 3 months

## 2020-01-23 NOTE — Assessment & Plan Note (Signed)
Patient working on healthy lifestyle Continue current medication Follow up in 3 months

## 2020-01-23 NOTE — Telephone Encounter (Signed)
Patient advised to increase Atenolol to 50 mg daily. New prescription sent to [facility] on [address].

## 2020-02-06 ENCOUNTER — Encounter: Payer: Self-pay | Admitting: Family Medicine

## 2020-03-05 ENCOUNTER — Ambulatory Visit: Payer: Self-pay | Admitting: Family Medicine

## 2020-03-31 ENCOUNTER — Encounter: Payer: Self-pay | Admitting: Family Medicine

## 2020-04-17 NOTE — Progress Notes (Signed)
Trena Platt Cummings,acting as a scribe for Cynthia Durie, MD.,have documented all relevant documentation on the behalf of Cynthia Durie, MD,as directed by  Cynthia Durie, MD while in the presence of Cynthia Durie, MD.  Complete physical exam   Patient: Cynthia Blankenship   DOB: 1961-05-24   59 y.o. Female  MRN: [id number] Visit Date: 04/22/2020  Today's healthcare provider: Wilhemena Durie, MD   Chief Complaint  Patient presents with  . Annual Exam   Subjective    Cynthia Blankenship is a 59 y.o. female who presents today for a complete physical exam.  She reports consuming a general and low sodium diet. Home exercise routine includes walking. She generally feels well. She reports sleeping well. She does not have additional problems to discuss today.   HPI    No past medical history on file. Past Surgical History:  Procedure Laterality Date  . ABDOMINAL HYSTERECTOMY    . TUBAL LIGATION     Social History   Socioeconomic History  . Marital status: Married    Spouse name: Not on file  . Number of children: Not on file  . Years of education: Not on file  . Highest education level: Not on file  Occupational History  . Not on file  Tobacco Use  . Smoking status: Former Research scientist (life sciences)  . Smokeless tobacco: Never Used  Substance and Sexual Activity  . Alcohol use: No    Alcohol/week: 0.0 standard drinks  . Drug use: No  . Sexual activity: Not on file  Other Topics Concern  . Not on file  Social History Narrative  . Not on file   Social Determinants of Health   Financial Resource Strain:   . Difficulty of Paying Living Expenses:   Food Insecurity:   . Worried About Charity fundraiser in the Last Year:   . Arboriculturist in the Last Year:   Transportation Needs:   . Film/video editor (Medical):   Marland Kitchen Lack of Transportation (Non-Medical):   Physical Activity:   . Days of Exercise per Week:   . Minutes of Exercise per Session:   Stress:   .  Feeling of Stress :   Social Connections:   . Frequency of Communication with Friends and Family:   . Frequency of Social Gatherings with Friends and Family:   . Attends Religious Services:   . Active Member of Clubs or Organizations:   . Attends Archivist Meetings:   Marland Kitchen Marital Status:   Intimate Partner Violence:   . Fear of Current or Ex-Partner:   . Emotionally Abused:   Marland Kitchen Physically Abused:   . Sexually Abused:    Family Status  Relation Name Status  . Mother  Deceased  . Father  Deceased  . Sister  Alive  . Brother  Alive  . Brother  Alive  . Sister  Alive  . Sister  Alive  . Neg Hx  (Not Specified)   Family History  Problem Relation Age of Onset  . Hypertension Mother   . Alcohol abuse Father   . Cirrhosis Father   . Breast cancer Neg Hx    No Known Allergies  Patient Care Team: Jerrol Banana., MD as PCP - General (Family Medicine)   Medications: Outpatient Medications Prior to Visit  Medication Sig  . atenolol (TENORMIN) 50 MG tablet Take 1 tablet (50 mg total) by mouth daily.  . EUTHYROX 50 MCG tablet  Take 1 tablet by mouth once daily  . Multiple Vitamin (MULTIVITAMIN) tablet Take 1 tablet by mouth daily.  Marland Kitchen ALPRAZolam (XANAX) 0.25 MG tablet Take 1 tablet (0.25 mg total) by mouth 3 (three) times daily as needed for anxiety. (Patient not taking: Reported on 08/14/2019)   No facility-administered medications prior to visit.    Review of Systems  Constitutional: Negative.   HENT: Negative.   Eyes: Negative.   Respiratory: Negative.   Cardiovascular: Negative.   Gastrointestinal: Negative.   Endocrine: Negative.   Genitourinary: Negative.   Musculoskeletal: Negative.   Skin: Negative.   Allergic/Immunologic: Negative.   Neurological: Negative.   Hematological: Negative.   Psychiatric/Behavioral: Negative.     Last thyroid functions Lab Results  Component Value Date   TSH 17.000 (H) 04/22/2020      Objective    BP (!) 161/91  (BP Location: Left Arm, Patient Position: Sitting, Cuff Size: Normal)   Pulse 73   Temp 98.2 F (36.8 C) (Oral)   Ht 5\' 1"  (1.549 m)   Wt 133 lb 6.4 oz (60.5 kg)   BMI 25.21 kg/m  Wt Readings from Last 3 Encounters:  04/22/20 133 lb 6.4 oz (60.5 kg)  01/23/20 132 lb 6.4 oz (60.1 kg)  12/24/19 133 lb 12.8 oz (60.7 kg)      Physical Exam Vitals reviewed.  Constitutional:      Appearance: Normal appearance.  HENT:     Head: Normocephalic and atraumatic.     Right Ear: External ear normal.     Left Ear: External ear normal.  Eyes:     General: No scleral icterus.    Conjunctiva/sclera: Conjunctivae normal.  Cardiovascular:     Rate and Rhythm: Normal rate and regular rhythm.     Pulses: Normal pulses.     Heart sounds: Normal heart sounds.  Pulmonary:     Effort: Pulmonary effort is normal.     Breath sounds: Normal breath sounds.  Musculoskeletal:     Right lower leg: No edema.     Left lower leg: No edema.  Skin:    General: Skin is warm and dry.  Neurological:     General: No focal deficit present.     Mental Status: She is alert and oriented to person, place, and time.  Psychiatric:        Mood and Affect: Mood normal.        Behavior: Behavior normal.        Thought Content: Thought content normal.        Judgment: Judgment normal.       Last depression screening scores PHQ 2/9 Scores 08/14/2019 03/28/2019 03/22/2018  PHQ - 2 Score 3 0 0  PHQ- 9 Score - 0 0   Last fall risk screening Fall Risk  01/23/2020  Falls in the past year? 0  Number falls in past yr: 0  Injury with Fall? 0  Risk for fall due to : No Fall Risks  Follow up Falls evaluation completed   Last Audit-C alcohol use screening Alcohol Use Disorder Test (AUDIT) 03/28/2019  1. How often do you have a drink containing alcohol? 0  2. How many drinks containing alcohol do you have on a typical day when you are drinking? -  3. How often do you have six or more drinks on one occasion? -  AUDIT-C  Score -  Alcohol Brief Interventions/Follow-up -   A score of 3 or more in women, and 4 or more in  men indicates increased risk for alcohol abuse, EXCEPT if all of the points are from question 1   No results found for any visits on 04/22/20.  Assessment & Plan    Routine Health Maintenance and Physical Exam  Exercise Activities and Dietary recommendations Goals   None     Immunization History  Administered Date(s) Administered  . Influenza Inj Mdck Quad Pf 06/24/2017, 06/10/2018  . Influenza-Unspecified 07/14/2015  . Td 03/28/2019  . Tdap 11/27/2007    Health Maintenance  Topic Date Due  . COVID-19 Vaccine (1) Never done  . HIV Screening  Never done  . COLONOSCOPY  Never done  . PAP SMEAR-Modifier  03/17/2018  . INFLUENZA VACCINE  04/13/2020  . MAMMOGRAM  05/13/2021  . TETANUS/TDAP  03/27/2029  . Hepatitis C Screening  Completed    Discussed health benefits of physical activity, and encouraged her to engage in regular exercise appropriate for her age and condition.    No follow-ups on file.        Ronson Hagins Cranford Mon, MD  [facility] [phone] (phone) [phone] (fax)  [place]

## 2020-04-22 ENCOUNTER — Encounter: Payer: Self-pay | Admitting: Family Medicine

## 2020-04-22 ENCOUNTER — Other Ambulatory Visit: Payer: Self-pay | Admitting: Family Medicine

## 2020-04-22 ENCOUNTER — Other Ambulatory Visit: Payer: Self-pay

## 2020-04-22 ENCOUNTER — Ambulatory Visit (INDEPENDENT_AMBULATORY_CARE_PROVIDER_SITE_OTHER): Payer: 59 | Admitting: Family Medicine

## 2020-04-22 VITALS — BP 175/84 | HR 75 | Temp 98.2°F | Ht 61.0 in | Wt 133.4 lb

## 2020-04-22 DIAGNOSIS — Z1211 Encounter for screening for malignant neoplasm of colon: Secondary | ICD-10-CM

## 2020-04-22 DIAGNOSIS — E039 Hypothyroidism, unspecified: Secondary | ICD-10-CM | POA: Diagnosis not present

## 2020-04-22 DIAGNOSIS — D649 Anemia, unspecified: Secondary | ICD-10-CM | POA: Diagnosis not present

## 2020-04-22 DIAGNOSIS — E538 Deficiency of other specified B group vitamins: Secondary | ICD-10-CM

## 2020-04-22 DIAGNOSIS — I10 Essential (primary) hypertension: Secondary | ICD-10-CM | POA: Diagnosis not present

## 2020-04-22 LAB — IFOBT (OCCULT BLOOD): IFOBT: NEGATIVE

## 2020-04-23 LAB — CBC WITH DIFFERENTIAL/PLATELET
Basophils Absolute: 0 10*3/uL (ref 0.0–0.2)
Basos: 1 %
EOS (ABSOLUTE): 0.1 10*3/uL (ref 0.0–0.4)
Eos: 2 %
Hematocrit: 41.2 % (ref 34.0–46.6)
Hemoglobin: 13.5 g/dL (ref 11.1–15.9)
Immature Grans (Abs): 0 10*3/uL (ref 0.0–0.1)
Immature Granulocytes: 0 %
Lymphocytes Absolute: 1.5 10*3/uL (ref 0.7–3.1)
Lymphs: 48 %
MCH: 28.7 pg (ref 26.6–33.0)
MCHC: 32.8 g/dL (ref 31.5–35.7)
MCV: 88 fL (ref 79–97)
Monocytes Absolute: 0.3 10*3/uL (ref 0.1–0.9)
Monocytes: 10 %
Neutrophils Absolute: 1.3 10*3/uL — ABNORMAL LOW (ref 1.4–7.0)
Neutrophils: 39 %
Platelets: 239 10*3/uL (ref 150–450)
RBC: 4.71 x10E6/uL (ref 3.77–5.28)
RDW: 13.2 % (ref 11.7–15.4)
WBC: 3.3 10*3/uL — ABNORMAL LOW (ref 3.4–10.8)

## 2020-04-23 LAB — TSH: TSH: 17 u[IU]/mL — ABNORMAL HIGH (ref 0.450–4.500)

## 2020-04-23 LAB — LIPID PANEL
Chol/HDL Ratio: 3.2 ratio (ref 0.0–4.4)
Cholesterol, Total: 184 mg/dL (ref 100–199)
HDL: 57 mg/dL (ref >39)
LDL Chol Calc (NIH): 113 mg/dL — ABNORMAL HIGH (ref 0–99)
Triglycerides: 78 mg/dL (ref 0–149)
VLDL Cholesterol Cal: 14 mg/dL (ref 5–40)

## 2020-04-23 LAB — COMPREHENSIVE METABOLIC PANEL
ALT: 14 IU/L (ref 0–32)
AST: 17 IU/L (ref 0–40)
Albumin/Globulin Ratio: 1.5 (ref 1.2–2.2)
Albumin: 4.5 g/dL (ref 3.8–4.9)
Alkaline Phosphatase: 79 IU/L (ref 48–121)
BUN/Creatinine Ratio: 14 (ref 9–23)
BUN: 10 mg/dL (ref 6–24)
Bilirubin Total: 0.7 mg/dL (ref 0.0–1.2)
CO2: 22 mmol/L (ref 20–29)
Calcium: 10 mg/dL (ref 8.7–10.2)
Chloride: 102 mmol/L (ref 96–106)
Creatinine, Ser: 0.71 mg/dL (ref 0.57–1.00)
GFR calc Af Amer: 108 mL/min/{1.73_m2} (ref >59)
GFR calc non Af Amer: 94 mL/min/{1.73_m2} (ref >59)
Globulin, Total: 3.1 g/dL (ref 1.5–4.5)
Glucose: 96 mg/dL (ref 65–99)
Potassium: 3.9 mmol/L (ref 3.5–5.2)
Sodium: 140 mmol/L (ref 134–144)
Total Protein: 7.6 g/dL (ref 6.0–8.5)

## 2020-04-25 ENCOUNTER — Telehealth: Payer: Self-pay

## 2020-04-25 DIAGNOSIS — E039 Hypothyroidism, unspecified: Secondary | ICD-10-CM

## 2020-04-25 MED ORDER — LEVOTHYROXINE SODIUM 75 MCG PO TABS: 75.0000 ug | ORAL_TABLET | Freq: Every day | ORAL | 2 refills | Status: DC

## 2020-04-25 NOTE — Telephone Encounter (Signed)
-----   Message from Jerrol Banana., MD sent at 04/25/2020  9:14 AM EDT ----- Labs okay, would increase Synthroid from 50 to 75 mcg daily and recheck in 2 months.

## 2020-04-25 NOTE — Telephone Encounter (Signed)
Called to advise patient of labs and also advise patient that medication would be increased. Medication sent to patients pharmacy.

## 2020-05-07 ENCOUNTER — Telehealth: Payer: Self-pay

## 2020-05-07 DIAGNOSIS — Z1231 Encounter for screening mammogram for malignant neoplasm of breast: Secondary | ICD-10-CM

## 2020-05-07 NOTE — Telephone Encounter (Signed)
Spoke with patient on phone and advised her that 3d bilateral mammogram on 05/14/2019 recommended screening mammogram in one year, order will be placed in today. KW     Copied from [place] [id number]. Topic: General - Other >> May 07, 2020  3:50 PM Wynetta Emery, [place] C wrote: Reason for CRM: pt called in to inquire. Pt says that she recently had her cpe with her PCP and want to know if she should have her mammogram ?   Please assist

## 2020-05-16 ENCOUNTER — Telehealth: Payer: Self-pay

## 2020-05-16 NOTE — Telephone Encounter (Signed)
Copied from [place] [id number]. Topic: General - Other >> May 16, 2020  2:13 PM Rainey Pines A wrote: Patient called to inform PCP that her thyroid medication levothyroxine (EUTHYROX) 75 MCG tablet  she feels is too strong for her. It makes her jittery on some days. Please advise

## 2020-05-16 NOTE — Telephone Encounter (Signed)
Patient advised, she said she will give [place] a call back on Monday and let us know how she is feeling.

## 2020-05-16 NOTE — Telephone Encounter (Signed)
She can alternate 47mcg with 64mcg. Call back next Tuesday or Wednesday when Dr. Rosanna Randy is here and let him know if that is any better.

## 2020-05-22 ENCOUNTER — Telehealth: Payer: Self-pay

## 2020-05-22 NOTE — Telephone Encounter (Signed)
Decrease dose to just 73mcg daily. It will take a few weeks to feel better.

## 2020-05-22 NOTE — Telephone Encounter (Signed)
Copied from [place] [id number]. Topic: General - Other >> May 22, 2020 12:42 PM Rainey Pines A wrote: Patient wanted to inform Dr. Rosanna Randy that she is still feeling nervous as a results of the medication dosage for the levothyroxine [facility]) medication and wants to know what Dr. Rosanna Randy would advise her to do next.

## 2020-05-23 NOTE — Telephone Encounter (Signed)
Patient advised and verbalized understanding 

## 2020-06-06 NOTE — Progress Notes (Signed)
Established patient visit   Patient: Cynthia Blankenship   DOB: 1960-10-13   59 y.o. Female  MRN: [id number] Visit Date: 06/09/2020  Today's healthcare provider: Wilhemena Durie, MD   Chief Complaint  Patient presents with  . Hypothyroidism   Subjective    HPI  Patient is doing well overall but still feels jittery at times with her thyroid.  She alternated 75 and 50 mcg up till about 2 weeks and has been on 50 mcg for the past 2 weeks. Hypothyroid, follow-up  Lab Results  Component Value Date   TSH 17.000 (H) 04/22/2020   TSH 0.016 (L) 12/25/2019   TSH 26.800 (H) 08/16/2019   Wt Readings from Last 3 Encounters:  06/09/20 130 lb (59 kg)  04/22/20 133 lb 6.4 oz (60.5 kg)  01/23/20 132 lb 6.4 oz (60.1 kg)    She was last seen for hypothyroid 1 months ago.  Management since that visit includes; labs checked. Increased levothyroxine to 75 mcg. Patient switch back to 50 mcg due to side effects. She reports good compliance with treatment. She is having side effects. Feeling unbalanced and "jittery"  Symptoms: No change in energy level No constipation  No diarrhea No heat / cold intolerance  Yes nervousness No palpitations  Yes weight changes    -----------------------------------------------------------------------------------------   Patient Active Problem List   Diagnosis Date Noted  . Essential hypertension 01/23/2020  . Anxiety attack 01/23/2020  . Impingement syndrome of shoulder, left 04/08/2016  . Polyarthralgia 04/08/2016  . Allergic rhinitis 06/02/2015  . 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 Adult hypothyroidism 01/17/2015  . Leiomyoma of uterus 01/17/2015  . Avitaminosis D 01/17/2015  . 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 No pertinent past medical history. No Known Allergies     Medications: Outpatient Medications Prior to Visit  Medication Sig  . atenolol (TENORMIN) 50 MG tablet Take 1 tablet (50 mg total) by mouth daily.  Arna Medici 50 MCG  tablet Take 1 tablet by mouth once daily  . Multiple Vitamin (MULTIVITAMIN) tablet Take 1 tablet by mouth daily.  Marland Kitchen ALPRAZolam (XANAX) 0.25 MG tablet Take 1 tablet (0.25 mg total) by mouth 3 (three) times daily as needed for anxiety. (Patient not taking: Reported on 08/14/2019)  . levothyroxine (EUTHYROX) 75 MCG tablet Take 1 tablet (75 mcg total) by mouth daily before breakfast. (Patient not taking: Reported on 06/09/2020)   No facility-administered medications prior to visit.    Review of Systems  Constitutional: Negative for appetite change, chills, fatigue and fever.  Respiratory: Negative for chest tightness and shortness of breath.   Cardiovascular: Negative for chest pain and palpitations.  Gastrointestinal: Negative for abdominal pain, nausea and vomiting.  Neurological: Negative for dizziness and weakness.    Last thyroid functions Lab Results  Component Value Date   TSH 17.000 (H) 04/22/2020    167/75 9  Objective    BP (!) 167/75   Pulse 94   Temp 98.6 F (37 C) (Oral)   Resp 16   Wt 130 lb (59 kg)   SpO2 99%   BMI 24.56 kg/m  BP Readings from Last 3 Encounters:  06/09/20 (!) 167/75  04/22/20 (!) 175/84  01/23/20 (!) 163/78   Wt Readings from Last 3 Encounters:  06/09/20 130 lb (59 kg)  04/22/20 133 lb 6.4 oz (60.5 kg)  01/23/20 132 lb 6.4 oz (60.1 kg)      Physical Exam Vitals reviewed.  Constitutional:      Appearance:  Normal appearance.  HENT:     Head: Normocephalic and atraumatic.     Right Ear: External ear normal.     Left Ear: External ear normal.  Eyes:     General: No scleral icterus.    Conjunctiva/sclera: Conjunctivae normal.  Cardiovascular:     Rate and Rhythm: Normal rate and regular rhythm.     Pulses: Normal pulses.     Heart sounds: Normal heart sounds.  Pulmonary:     Effort: Pulmonary effort is normal.     Breath sounds: Normal breath sounds.  Musculoskeletal:     Right lower leg: No edema.     Left lower leg: No edema.   Skin:    General: Skin is warm and dry.  Neurological:     General: No focal deficit present.     Mental Status: She is alert and oriented to person, place, and time.     Comments: Brisk, 3+ reflexes  Psychiatric:        Mood and Affect: Mood normal.        Behavior: Behavior normal.        Thought Content: Thought content normal.        Judgment: Judgment normal.       No results found for any visits on 06/09/20.  Assessment & Plan     1. Thyroid disease Consider endocrine referral depending on results of labs. She asked as though she is feels hyperthyroid but this just may be her anxiety state. - TSH+T4F+T3Free  2. Essential hypertension Fair control.  May need to increase atenolol or add amlodipine or chlorthalidone  3. GAD (generalized anxiety disorder) May need an SSRI to treat this going forward.   No follow-ups on file.      I, Wilhemena Durie, MD, have reviewed all documentation for this visit. The documentation on 06/10/20 for the exam, diagnosis, procedures, and orders are all accurate and complete.    Richard Cranford Mon, MD  [facility] [phone] (phone) [phone] (fax)  [place]

## 2020-06-09 ENCOUNTER — Ambulatory Visit: Payer: 59 | Admitting: Family Medicine

## 2020-06-09 ENCOUNTER — Encounter: Payer: Self-pay | Admitting: Family Medicine

## 2020-06-09 ENCOUNTER — Other Ambulatory Visit: Payer: Self-pay

## 2020-06-09 VITALS — BP 167/75 | HR 94 | Temp 98.6°F | Resp 16 | Wt 130.0 lb

## 2020-06-09 DIAGNOSIS — E079 Disorder of thyroid, unspecified: Secondary | ICD-10-CM | POA: Diagnosis not present

## 2020-06-09 DIAGNOSIS — I10 Essential (primary) hypertension: Secondary | ICD-10-CM | POA: Diagnosis not present

## 2020-06-09 DIAGNOSIS — F411 Generalized anxiety disorder: Secondary | ICD-10-CM | POA: Diagnosis not present

## 2020-06-10 LAB — TSH+T4F+T3FREE
Free T4: 1.47 ng/dL (ref 0.82–1.77)
T3, Free: 2.4 pg/mL (ref 2.0–4.4)
TSH: 23.7 u[IU]/mL — ABNORMAL HIGH (ref 0.450–4.500)

## 2020-06-12 ENCOUNTER — Telehealth: Payer: Self-pay

## 2020-06-12 NOTE — Telephone Encounter (Signed)
-----   Message from Jerrol Banana., MD sent at 06/12/2020  9:13 AM EDT ----- Thyroid level looks to still be low.  I can refer her to endocrinology since she does not feel well.

## 2020-06-12 NOTE — Telephone Encounter (Signed)
Patient notified of lab result- patient wants to know if she is to continue same dosing of her medication- she needs to pick up her RF- but doesn't want to get it if dosing change recommended. Please let her know.

## 2020-06-12 NOTE — Telephone Encounter (Signed)
Called to advise patient of lab results, no answer. LVMTCB, if patient calls back it is okay for someone else to advise.

## 2020-06-13 ENCOUNTER — Other Ambulatory Visit: Payer: Self-pay | Admitting: Family Medicine

## 2020-06-13 DIAGNOSIS — E039 Hypothyroidism, unspecified: Secondary | ICD-10-CM

## 2020-06-13 NOTE — Telephone Encounter (Signed)
See patient phone note.

## 2020-06-13 NOTE — Telephone Encounter (Signed)
   Notes to clinic Two rx for this med, this is not the most recent, please assess.

## 2020-06-16 ENCOUNTER — Telehealth: Payer: Self-pay

## 2020-06-16 DIAGNOSIS — E039 Hypothyroidism, unspecified: Secondary | ICD-10-CM

## 2020-06-16 MED ORDER — LEVOTHYROXINE SODIUM 75 MCG PO TABS: 75.0000 ug | ORAL_TABLET | Freq: Every day | ORAL | 1 refills | Status: DC

## 2020-06-16 NOTE — Telephone Encounter (Signed)
Go back to higher dose.

## 2020-06-16 NOTE — Telephone Encounter (Signed)
Copied from [place] [id number]. Topic: General - Other >> Jun 16, 2020 12:50 PM Mcneil, Ja-Kwan wrote: Reason for CRM: Pt called for an update on the refill for thyroid medication. Pt stated the pharmacy only provided her a few pills for the weekend

## 2020-06-17 ENCOUNTER — Other Ambulatory Visit: Payer: Self-pay | Admitting: Family Medicine

## 2020-06-17 DIAGNOSIS — E039 Hypothyroidism, unspecified: Secondary | ICD-10-CM

## 2020-06-17 NOTE — Telephone Encounter (Signed)
LMOVM for pt to return call 

## 2020-06-17 NOTE — Telephone Encounter (Signed)
Copied from [place] [id number]. Topic: General - Call Back - No Documentation >> Jun 17, 2020 12:36 PM Cynthia Blankenship wrote: PT returning the call / please advise

## 2020-06-18 ENCOUNTER — Telehealth: Payer: Self-pay

## 2020-06-18 DIAGNOSIS — E039 Hypothyroidism, unspecified: Secondary | ICD-10-CM

## 2020-06-18 NOTE — Telephone Encounter (Signed)
Patient was advised and referred to Endo.

## 2020-06-18 NOTE — Telephone Encounter (Signed)
Copied from [place] [phone]. Topic: General - Inquiry >> Jun 17, 2020  4:09 PM Gillis Ends D wrote: Reason for CRM: Patient called and stated that the Synthroid 58mcg is not enough but the Synthroid 58mcg is to much for her body. She feels shaky and not herself when she takes it. She would like for you to refer her to an Endocrinologist so she can try and get it worked out. In the meantime she would like a call back so she will know what she should do. She can be reached at [phone]. Please advise

## 2020-06-19 NOTE — Telephone Encounter (Signed)
Patient was advised and referred to Endo.

## 2020-06-23 ENCOUNTER — Other Ambulatory Visit: Payer: Self-pay

## 2020-06-23 ENCOUNTER — Ambulatory Visit (INDEPENDENT_AMBULATORY_CARE_PROVIDER_SITE_OTHER): Payer: 59 | Admitting: Internal Medicine

## 2020-06-23 ENCOUNTER — Encounter: Payer: Self-pay | Admitting: Internal Medicine

## 2020-06-23 VITALS — BP 150/80 | HR 66 | Ht 61.5 in | Wt 129.6 lb

## 2020-06-23 DIAGNOSIS — E039 Hypothyroidism, unspecified: Secondary | ICD-10-CM

## 2020-06-23 NOTE — Patient Instructions (Signed)

## 2020-06-23 NOTE — Progress Notes (Signed)
Name: Cynthia Blankenship  MRN/ DOB: [id number], 1961/01/01    Age/ Sex: 59 y.o., female    PCP: Jerrol Banana., MD   Reason for Endocrinology Evaluation: Hypothyroidism      Date of Initial Endocrinology Evaluation: 06/23/2020     HPI: [name] is a 58 y.o. female with a past medical history of hypothyroidism. The patient presented for initial endocrinology clinic visit on 06/23/2020 for consultative assistance with her  Hypothyroidism    She has been diagnosed with hypothyroidism and has been on Lt-4 replacement.   She is currently on  Levothyroxine 75 mcg daily  Prior to that she was on 100 mcg daily, but had a TSH 0.016 uIU/mL    Weight has been decreasing   Denies constipation  Denies palpitations   Denies local neck symptoms    No FH of thyroid disease   No biotin   HISTORY:   Past Medical History: No past medical history on file. Past Surgical History:  Past Surgical History:  Procedure Laterality Date  . ABDOMINAL HYSTERECTOMY    . TUBAL LIGATION        Social History:  reports that she has quit smoking. She has never used smokeless tobacco. She reports that she does not drink alcohol and does not use drugs.  Family History: family history includes Alcohol abuse in her father; Cirrhosis in her father; Hypertension in her mother.   HOME MEDICATIONS: Allergies as of 06/23/2020   No Known Allergies     Medication List       Accurate as of June 23, 2020  3:05 PM. If you have any questions, ask your nurse or doctor.        ALPRAZolam 0.25 MG tablet Commonly known as: XANAX Take 1 tablet (0.25 mg total) by mouth 3 (three) times daily as needed for anxiety.   atenolol 50 MG tablet Commonly known as: TENORMIN Take 1 tablet (50 mg total) by mouth daily.   Euthyrox 50 MCG tablet Generic drug: levothyroxine Take 1 tablet by mouth once daily   levothyroxine 75 MCG tablet Commonly known as: Euthyrox Take 1 tablet (75 mcg  total) by mouth daily before breakfast.   multivitamin tablet Take 1 tablet by mouth daily.         REVIEW OF SYSTEMS: A comprehensive ROS was conducted with the patient and is negative except as per HPI   OBJECTIVE:  VS: BP (!) 150/80   Pulse 66   Ht 5' 1.5" (1.562 m)   Wt 129 lb 9.6 oz (58.8 kg)   SpO2 99%   BMI 24.09 kg/m    Wt Readings from Last 3 Encounters:  06/09/20 130 lb (59 kg)  04/22/20 133 lb 6.4 oz (60.5 kg)  01/23/20 132 lb 6.4 oz (60.1 kg)     EXAM: General: Pt appears well and is in NAD  Neck: General: Supple without adenopathy. Thyroid: Thyroid size normal.  No goiter or nodules appreciated. No thyroid bruit.  Lungs: Clear with good BS bilat with no rales, rhonchi, or wheezes  Heart: Auscultation: RRR.  Abdomen: Normoactive bowel sounds, soft, nontender, without masses or organomegaly palpable  Extremities:  BL LE: No pretibial edema normal ROM and strength.  Skin: Hair: Texture and amount normal with gender appropriate distribution Skin Inspection: No rashes Skin Palpation: Skin temperature, texture, and thickness normal to palpation  Neuro: Cranial nerves: II - XII grossly intact  Motor: Normal strength throughout DTRs: 2+ and symmetric in  UE without delay in relaxation phase  Mental Status: Judgment, insight: Intact Orientation: Oriented to time, place, and person Mood and affect: No depression, anxiety, or agitation     DATA REVIEWED: Results for Cynthia, Blankenship (MRN [id number]) as of 06/24/2020 15:58  Ref. Range 12/25/2019 15:46 04/22/2020 09:38 06/09/2020 15:36  TSH Latest Ref Range: 0.450 - 4.500 uIU/mL 0.016 (L) 17.000 (H) 23.700 (H)  Triiodothyronine,Free,Serum Latest Ref Range: 2.0 - 4.4 pg/mL   2.4      ASSESSMENT/PLAN/RECOMMENDATIONS:   1. Hypothyroidism:  - Pt is clinically euthyroid  - No local neck symptoms  - Pt educated extensively on the correct way to take levothyroxine (first thing in the morning with water, 30 minutes  before eating or taking other medications). - Pt encouraged to double dose the following day if she were to miss a dose given long half-life of levothyroxine. - Her dose was recently adjusted thus its too soon to check. Will recheck in 6 weeks   Medications : Continue Levothyroxine 75 mcg daily      F/U in 3 months  Labs in 6 weeks      Signed electronically by: Mack Guise, MD  [facility]  [facility] [address] [place], [place] [address] Phone: [phone] FAX: [phone]   CC: Jerrol Banana., MD [address] [place] [address] Phone: [phone] Fax: [phone]   Return to Endocrinology clinic as below: Future Appointments  Date Time Provider [place]  06/24/2020  4:00 PM [facility]  07/31/2020  8:40 AM Jerrol Banana., MD [facility]  10/23/2020  8:40 AM Jerrol Banana., MD [facility]

## 2020-06-24 ENCOUNTER — Ambulatory Visit
Admission: RE | Admit: 2020-06-24 | Discharge: 2020-06-24 | Disposition: A | Discharge status: Home / Self Care | Payer: 59 | Source: Ambulatory Visit | Attending: Family Medicine | Admitting: Family Medicine

## 2020-06-24 DIAGNOSIS — Z1231 Encounter for screening mammogram for malignant neoplasm of breast: Secondary | ICD-10-CM | POA: Insufficient documentation

## 2020-06-24 DIAGNOSIS — E039 Hypothyroidism, unspecified: Secondary | ICD-10-CM | POA: Insufficient documentation

## 2020-07-20 ENCOUNTER — Other Ambulatory Visit: Payer: Self-pay | Admitting: Family Medicine

## 2020-07-20 DIAGNOSIS — I10 Essential (primary) hypertension: Secondary | ICD-10-CM

## 2020-07-20 NOTE — Telephone Encounter (Signed)
Requested Prescriptions  Pending Prescriptions Disp Refills  . atenolol (TENORMIN) 50 MG tablet [Pharmacy Med Name: Atenolol 50 MG Oral Tablet] 90 tablet 1    Sig: Take 1 tablet by mouth once daily     Cardiovascular:  Beta Blockers Failed - 07/20/2020  6:30 AM      Failed - Last BP in normal range    BP Readings from Last 1 Encounters:  06/23/20 (!) 150/80         Passed - Last Heart Rate in normal range    Pulse Readings from Last 1 Encounters:  06/23/20 66         Passed - Valid encounter within last 6 months    Recent Outpatient Visits          1 month ago Thyroid disease   [place] Jerrol Banana., MD   2 months ago Adult hypothyroidism   [facility] Jerrol Banana., MD   5 months ago Adult hypothyroidism   [facility] Jerrol Banana., MD   6 months ago Adult hypothyroidism   [facility] Jerrol Banana., MD   10 months ago Adult hypothyroidism   [facility] Jerrol Banana., MD      Future Appointments            In 3 months Jerrol Banana., MD [facility], [place]

## 2020-07-31 ENCOUNTER — Ambulatory Visit: Payer: 59 | Admitting: Family Medicine

## 2020-08-04 ENCOUNTER — Telehealth: Payer: Self-pay | Admitting: Internal Medicine

## 2020-08-04 ENCOUNTER — Other Ambulatory Visit (INDEPENDENT_AMBULATORY_CARE_PROVIDER_SITE_OTHER): Payer: 59

## 2020-08-04 ENCOUNTER — Other Ambulatory Visit: Payer: Self-pay

## 2020-08-04 DIAGNOSIS — E039 Hypothyroidism, unspecified: Secondary | ICD-10-CM | POA: Diagnosis not present

## 2020-08-04 LAB — TSH: TSH: 1.04 u[IU]/mL (ref 0.35–4.50)

## 2020-08-04 NOTE — Telephone Encounter (Signed)
Spoken to patient and notified Dr Shamleffer's comments. Verbalized understanding.   

## 2020-08-04 NOTE — Telephone Encounter (Signed)
Message left for patient to return my call.  

## 2020-08-04 NOTE — Telephone Encounter (Signed)
Please let the pt know her thyroid is normal and to continue current dose of levothyroxine     Thanks    Abby Nena Jordan, MD  [facility] Endocrinology  [facility] [address] [place], [place] [address] Phone: [phone] FAX: [phone]

## 2020-09-25 ENCOUNTER — Ambulatory Visit: Payer: 59 | Admitting: Internal Medicine

## 2020-10-14 ENCOUNTER — Other Ambulatory Visit: Payer: Self-pay

## 2020-10-17 ENCOUNTER — Encounter: Payer: Self-pay | Admitting: Internal Medicine

## 2020-10-17 ENCOUNTER — Ambulatory Visit (INDEPENDENT_AMBULATORY_CARE_PROVIDER_SITE_OTHER): Payer: 59 | Admitting: Internal Medicine

## 2020-10-17 ENCOUNTER — Other Ambulatory Visit: Payer: Self-pay

## 2020-10-17 VITALS — BP 116/82 | HR 70 | Ht 61.5 in | Wt 131.4 lb

## 2020-10-17 DIAGNOSIS — E039 Hypothyroidism, unspecified: Secondary | ICD-10-CM | POA: Diagnosis not present

## 2020-10-17 LAB — TSH: TSH: 1.47 u[IU]/mL (ref 0.35–4.50)

## 2020-10-17 MED ORDER — LEVOTHYROXINE SODIUM 75 MCG PO TABS: 75.0000 ug | ORAL_TABLET | Freq: Every day | ORAL | 3 refills | Status: DC

## 2020-10-17 NOTE — Patient Instructions (Signed)

## 2020-10-17 NOTE — Progress Notes (Signed)
Name: Cynthia Blankenship  MRN/ DOB: [id number], 1961/02/28    Age/ Sex: 60 y.o., female     PCP: Jerrol Banana., MD   Reason for Endocrinology Evaluation: Hypothyroidism     Initial Endocrinology Clinic Visit: 06/23/2020    PATIENT IDENTIFIER: [name] is a 60 y.o., female with a past medical history of Hypothyroidism.  She has followed with [facility] since 06/23/2020 for consultative assistance with management of her hypothyroidism.   HISTORICAL SUMMARY:   She has been diagnosed with hypothyroidism for years and has been on Lt-4 replacement.   She is currently on  Levothyroxine 75 mcg daily  Prior to that she was on 100 mcg daily, but had a TSH 0.016 uIU/mL    No FH of thyroid disease   SUBJECTIVE:     Today (10/17/2020):  [name] is here for hypothyroidism.   Weight has been stable  Denies constipation or diarrhea  Denies depression and anxiety  No local neck swelling   No biotin    Levothyroxine 75 mcg daily    HISTORY:   Past Medical History: No past medical history on file. Past Surgical History:  Past Surgical History:  Procedure Laterality Date  . ABDOMINAL HYSTERECTOMY    . TUBAL LIGATION      Social History:  reports that she has quit smoking. She has never used smokeless tobacco. She reports that she does not drink alcohol and does not use drugs. Family History:  Family History  Problem Relation Age of Onset  . Hypertension Mother   . Alcohol abuse Father   . Cirrhosis Father   . Breast cancer Neg Hx      HOME MEDICATIONS: Allergies as of 10/17/2020   No Known Allergies     Medication List       Accurate as of October 17, 2020  9:13 AM. If you have any questions, ask your nurse or doctor.        ALPRAZolam 0.25 MG tablet Commonly known as: XANAX Take 1 tablet (0.25 mg total) by mouth 3 (three) times daily as needed for anxiety.   atenolol 50 MG tablet Commonly known as: TENORMIN Take 1  tablet by mouth once daily   levothyroxine 75 MCG tablet Commonly known as: Euthyrox Take 1 tablet (75 mcg total) by mouth daily before breakfast.   multivitamin tablet Take 1 tablet by mouth daily.         OBJECTIVE:   PHYSICAL EXAM: VS: BP 116/82   Pulse 70   Ht 5' 1.5" (1.562 m)   Wt 131 lb 6 oz (59.6 kg)   SpO2 98%   BMI 24.42 kg/m    EXAM: General: Pt appears well and is in NAD  Neck: General: Supple without adenopathy. Thyroid: Thyroid size normal.  No goiter or nodules appreciated. No thyroid bruit.  Lungs: Clear with good BS bilat with no rales, rhonchi, or wheezes  Heart: Auscultation: RRR.  Abdomen: Normoactive bowel sounds, soft, nontender, without masses or organomegaly palpable  Extremities:  BL LE: No pretibial edema normal ROM and strength.  Mental Status: Judgment, insight: Intact Memory: Intact for recent and remote events Mood and affect: No depression, anxiety, or agitation     DATA REVIEWED:  Results for Cynthia Blankenship (MRN [id number]) as of 10/17/2020 13:02  Ref. Range 10/17/2020 09:21  TSH Latest Ref Range: 0.35 - 4.50 uIU/mL 1.47     ASSESSMENT / PLAN / RECOMMENDATIONS:   1. Hypothyroidism  -  Pt is clinically euthyroid  - No local neck symptoms  - Pt educated extensively on the correct way to take levothyroxine (first thing in the morning with water, 30 minutes before eating or taking other medications). - Pt encouraged to double dose the following day if she were to miss a dose given long half-life of levothyroxine.      Medications   Continue Levothyroxine 75 mcg daily    F/U in 1 yr   Signed electronically by: Mack Guise, MD  [facility]  [facility] [address] [place], [place] [address] Phone: [phone] FAX: [phone]      CC: Jerrol Banana., MD [address] [place] [address] Phone: [phone]  Fax: [phone]   Return to  Endocrinology clinic as below: Future Appointments  Date Time Provider [place]  10/23/2020  8:40 AM Jerrol Banana., MD [facility]

## 2020-10-20 ENCOUNTER — Telehealth: Payer: Self-pay | Admitting: Family Medicine

## 2020-10-20 ENCOUNTER — Telehealth: Payer: Self-pay

## 2020-10-20 NOTE — Telephone Encounter (Signed)
Medication Refill - Medication: levothyroxine (EUTHYROX) 75 MCG tablet     Preferred Pharmacy (with phone number or street name):  [place], [place] Phone:  [phone]  Fax:  [phone]       Agent: Please be advised that RX refills may take up to 3 business days. We ask that you follow-up with your pharmacy.

## 2020-10-20 NOTE — Telephone Encounter (Signed)
Copied from [place] [phone]. Topic: General - Other >> Oct 20, 2020  4:00 PM Keene Breath wrote: Reason for CRM: Patient would like the nurse to call her regarding an appt. That she cancelled with the doctor on 2/10.  Patient stated she already had her blood drawn last week and did not think she needed to see the doctor again so soon. If she does need that appt., patient said she can reschedule for a later time.  Please advise and call patient to discuss at [phone]

## 2020-10-20 NOTE — Telephone Encounter (Signed)
It looks I refilled on 2/4 ? Is it not there

## 2020-10-21 NOTE — Telephone Encounter (Signed)
Appt scheduled

## 2020-10-21 NOTE — Telephone Encounter (Signed)
Okay to see patient at a later date if she is feeling okay.

## 2020-10-21 NOTE — Telephone Encounter (Signed)
Spoken to [place] and Rx have been ready for pick up since 10/17/2020.  Message left for patient to return my call.

## 2020-10-22 ENCOUNTER — Telehealth: Payer: Self-pay | Admitting: Internal Medicine

## 2020-10-22 NOTE — Telephone Encounter (Signed)
Message left for patient to return my call.  

## 2020-10-22 NOTE — Telephone Encounter (Signed)
Spoken to patient and notified Dr Shamleffer's comments. Verbalized understanding.   

## 2020-10-22 NOTE — Telephone Encounter (Signed)
Pt was just calling back returning Chan's call.   Pt states Cynthia Blankenship can leave a detailed VM on her phone if she doesn't pick up.  Ph# [phone]

## 2020-10-23 ENCOUNTER — Ambulatory Visit: Payer: Self-pay | Admitting: Family Medicine

## 2021-01-18 ENCOUNTER — Other Ambulatory Visit: Payer: Self-pay | Admitting: Family Medicine

## 2021-01-18 DIAGNOSIS — I10 Essential (primary) hypertension: Secondary | ICD-10-CM

## 2021-01-18 NOTE — Telephone Encounter (Signed)
Requested Prescriptions  Pending Prescriptions Disp Refills  . atenolol (TENORMIN) 50 MG tablet [Pharmacy Med Name: Atenolol 50 MG Oral Tablet] 11 tablet 0    Sig: Take 1 tablet by mouth once daily     Cardiovascular:  Beta Blockers Failed - 01/18/2021  6:30 AM      Failed - Valid encounter within last 6 months    Recent Outpatient Visits          7 months ago Thyroid disease   [facility] Jerrol Banana., MD   9 months ago Adult hypothyroidism   [facility] Jerrol Banana., MD   12 months ago Adult hypothyroidism   [facility] Jerrol Banana., MD   1 year ago Adult hypothyroidism   [facility] Jerrol Banana., MD   1 year ago Adult hypothyroidism   [facility] Jerrol Banana., MD      Future Appointments            In 1 week Jerrol Banana., MD [facility]           Passed - Last BP in normal range    BP Readings from Last 1 Encounters:  10/17/20 116/82         Passed - Last Heart Rate in normal range    Pulse Readings from Last 1 Encounters:  10/17/20 70

## 2021-01-28 NOTE — Progress Notes (Signed)
Established patient visit   Patient: Cynthia Blankenship   DOB: October 30, 1960   60 y.o. Female  MRN: [id number] Visit Date: 01/29/2021  Today's healthcare provider: Wilhemena Durie, MD   Chief Complaint  Patient presents with  . Hypertension  . Anxiety  . Hypothyroidism   Subjective    HPI  Patient feels well.  Everything is stable. Hypothyroid, follow-up  Lab Results  Component Value Date   TSH 1.47 10/17/2020   TSH 1.04 08/04/2020   TSH 23.700 (H) 06/09/2020   FREET4 1.47 06/09/2020   Wt Readings from Last 3 Encounters:  01/29/21 132 lb (59.9 kg)  10/17/20 131 lb 6 oz (59.6 kg)  06/23/20 129 lb 9.6 oz (58.8 kg)    She was last seen for hypothyroid on 06/09/2020.   Management since that visit includes referring to Endocrinology. Patient was seen by Endocrinology on 10/17/2020. No changes were made during that visit. Per Endocrinology patient was to continue Levothyroxine 75 mcg daily and follow up in 1 year. She reports good compliance with treatment. She is not having side effects.   Symptoms: No change in energy level No constipation  No diarrhea No heat / cold intolerance  No nervousness No palpitations  No weight changes     Hypertension, follow-up  BP Readings from Last 3 Encounters:  01/29/21 (!) 174/83  10/17/20 116/82  06/23/20 (!) 150/80   Wt Readings from Last 3 Encounters:  01/29/21 132 lb (59.9 kg)  10/17/20 131 lb 6 oz (59.6 kg)  06/23/20 129 lb 9.6 oz (58.8 kg)     She was last seen for hypertension on 06/09/2020.   BP at that visit was 167/75. Management since that visit includes continuing same medications.  She reports good compliance with treatment. She is not having side effects.  She is following a Regular diet. She is exercising. She does not smoke.  Use of agents associated with hypertension: thyroid hormones.   Outside blood pressures are not checked. Symptoms: No chest pain No chest pressure  No palpitations No  syncope  No dyspnea No orthopnea  No paroxysmal nocturnal dyspnea No lower extremity edema   Pertinent labs: Lab Results  Component Value Date   CHOL 184 04/22/2020   HDL 57 04/22/2020   LDLCALC 113 (H) 04/22/2020   TRIG 78 04/22/2020   CHOLHDL 3.2 04/22/2020   Lab Results  Component Value Date   NA 140 04/22/2020   K 3.9 04/22/2020   CREATININE 0.71 04/22/2020   GFRNONAA 94 04/22/2020   GFRAA 108 04/22/2020   GLUCOSE 96 04/22/2020     The 10-year ASCVD risk score Mikey Bussing DC Jr., et al., 2013) is: 14.6%   ---------------------------------------------------------------------------------------------------  Anxiety, Follow-up  She was last seen for anxiety 7 months ago. Changes made at last visit include none. May need an SSRI to treat this going forward.    Patient has not been using the Alprazolam. She states she read about the possible side effects that it could cause and decided not to take it.   She feels her anxiety is mild and Improved since last visit.  Symptoms: No chest pain No difficulty concentrating  No dizziness No fatigue  No feelings of losing control No insomnia  No irritable No palpitations  No panic attacks No racing thoughts  No shortness of breath No sweating  No tremors/shakes    GAD-7 Results GAD-7 Generalized Anxiety Disorder Screening Tool 12/24/2019  1. Feeling Nervous, Anxious, or on Edge 1  2. Not Being Able to Stop or Control Worrying 1  3. Worrying Too Much About Different Things 1  4. Trouble Relaxing 1  5. Being So Restless it's Hard To Sit Still 1  6. Becoming Easily Annoyed or Irritable 1  7. Feeling Afraid As If Something Awful Might Happen 1  Total GAD-7 Score 7  Difficulty At Work, Home, or Getting  Along With Others? Not difficult at all    PHQ-9 Scores PHQ9 SCORE ONLY 04/22/2020 08/14/2019 03/28/2019  PHQ-9 Total Score 0 3 0     ---------------------------------------------------------------------------------------------------       Medications: Outpatient Medications Prior to Visit  Medication Sig  . atenolol (TENORMIN) 50 MG tablet Take 1 tablet by mouth once daily  . levothyroxine (EUTHYROX) 75 MCG tablet Take 1 tablet (75 mcg total) by mouth daily before breakfast.  . Multiple Vitamin (MULTIVITAMIN) tablet Take 1 tablet by mouth daily.  Marland Kitchen ALPRAZolam (XANAX) 0.25 MG tablet Take 1 tablet (0.25 mg total) by mouth 3 (three) times daily as needed for anxiety. (Patient not taking: Reported on 01/29/2021)   No facility-administered medications prior to visit.    Review of Systems  Constitutional: Negative for appetite change, chills, fatigue and fever.  Respiratory: Negative for chest tightness and shortness of breath.   Cardiovascular: Negative for chest pain and palpitations.  Gastrointestinal: Negative for abdominal pain, nausea and vomiting.  Neurological: Negative for dizziness and weakness.        Objective    BP (!) 174/83 (BP Location: Right Arm, Patient Position: Sitting, Cuff Size: Normal)   Pulse 73   Temp 98.4 F (36.9 C) (Temporal)   Resp 16   Wt 132 lb (59.9 kg)   BMI 24.54 kg/m  BP Readings from Last 3 Encounters:  01/29/21 (!) 156/80  10/17/20 116/82  06/23/20 (!) 150/80   Wt Readings from Last 3 Encounters:  01/29/21 132 lb (59.9 kg)  10/17/20 131 lb 6 oz (59.6 kg)  06/23/20 129 lb 9.6 oz (58.8 kg)       Today's Vitals   01/29/21 0811 01/29/21 0813  BP: (!) 156/80 (!) 174/83  Pulse: 73   Resp: 16   Temp: 98.4 F (36.9 C)   TempSrc: Temporal   Weight: 132 lb (59.9 kg)    Body mass index is 24.54 kg/m.  Physical Exam Vitals reviewed.  Constitutional:      Appearance: Normal appearance.  HENT:     Head: Normocephalic and atraumatic.     Right Ear: External ear normal.     Left Ear: External ear normal.  Eyes:     General: No scleral icterus.     Conjunctiva/sclera: Conjunctivae normal.  Cardiovascular:     Rate and Rhythm: Normal rate and regular rhythm.     Pulses: Normal pulses.     Heart sounds: Normal heart sounds.  Pulmonary:     Effort: Pulmonary effort is normal.     Breath sounds: Normal breath sounds.  Musculoskeletal:     Right lower leg: No edema.     Left lower leg: No edema.  Skin:    General: Skin is warm and dry.  Neurological:     General: No focal deficit present.     Mental Status: She is alert and oriented to person, place, and time.     Comments: Brisk, 3+ reflexes  Psychiatric:        Mood and Affect: Mood normal.        Behavior: Behavior normal.  Thought Content: Thought content normal.        Judgment: Judgment normal.       No results found for any visits on 01/29/21.  Assessment & Plan     1. Hypertension, unspecified type Fair control.  Get home blood pressure readings. - atenolol (TENORMIN) 50 MG tablet; Take 1 tablet (50 mg total) by mouth daily.  Dispense: 90 tablet; Refill: 3  2. Essential hypertension Consider adding another med on next visit  3. Acquired hypothyroidism Follow-up TSH   No follow-ups on file.      I, Wilhemena Durie, MD, have reviewed all documentation for this visit. The documentation on 02/06/21 for the exam, diagnosis, procedures, and orders are all accurate and complete.    Ell Tiso Cranford Mon, MD  [facility] [phone] (phone) [phone] (fax)  [place]

## 2021-01-29 ENCOUNTER — Other Ambulatory Visit: Payer: Self-pay

## 2021-01-29 ENCOUNTER — Ambulatory Visit: Payer: 59 | Admitting: Family Medicine

## 2021-01-29 ENCOUNTER — Encounter: Payer: Self-pay | Admitting: Family Medicine

## 2021-01-29 VITALS — BP 156/80 | HR 73 | Temp 98.4°F | Resp 16 | Wt 132.0 lb

## 2021-01-29 DIAGNOSIS — E039 Hypothyroidism, unspecified: Secondary | ICD-10-CM | POA: Diagnosis not present

## 2021-01-29 DIAGNOSIS — I10 Essential (primary) hypertension: Secondary | ICD-10-CM | POA: Diagnosis not present

## 2021-01-29 MED ORDER — ATENOLOL 50 MG PO TABS: 50.0000 mg | ORAL_TABLET | Freq: Every day | ORAL | 3 refills | Status: DC

## 2021-04-27 ENCOUNTER — Telehealth: Payer: Self-pay

## 2021-04-27 NOTE — Telephone Encounter (Signed)
Copied from [place] [phone]. Topic: General - Inquiry >> Apr 27, 2021  9:32 AM Valere Dross wrote: Reason for CRM: Pt called in stating if she could start taking her atenolol (TENORMIN) 50 MG tablet medication during the day instead of at night. Please advise.

## 2021-04-28 ENCOUNTER — Ambulatory Visit: Payer: Self-pay | Admitting: *Deleted

## 2021-04-28 NOTE — Telephone Encounter (Signed)
Patient call for PCP message. Patient disconnected or hung up prior to transfer of call. Called patient and left message to call back to review ok from PCP for patient to take atenolol 50 mg during the day instead of night.

## 2021-04-28 NOTE — Telephone Encounter (Signed)
LMOVM for pt to return call. Okay for [facility] triage to give patient message.

## 2021-04-28 NOTE — Telephone Encounter (Signed)
Pt called back at 5:20 saying she just took her BP and it was 162/81.  She said she will call back again in the am.

## 2021-04-28 NOTE — Telephone Encounter (Signed)
Patient returned call back to review message regarding atenolol 50 mg  taking during the day. Reviewed message from Dr. Rosanna Randy that "yes: it is ok for patient to take atenolol 50 mg during the day. Patient reports she is feeling some dizziness today and denies chest pain, lightheadedness, or difficulty breathing. Patient reports she is concerned of a bald spot on the top of her head. Patient reports she is not at home at this time but reports B/P Monday 161/77. Patient asking if she needs to wait until am to start her atenolol. Recommended patient to take her medication earlier tonight after checking B/P and take atenolol mid day tomorrow instead of skipping a dose to start during the day tomorrow morning due to her elevated B/P readings. Encouraged patient to call back if needed to review B/P and or symptoms . Care advise given. Patient verbalized understanding of care advise and to call back if needed. Please advise on how patient is to switch times to  take medication . Reason for Disposition  [1] MILD dizziness (e.g., walking normally) AND [2] has NOT been evaluated by physician for this  (Exception: dizziness caused by heat exposure, sudden standing, or poor fluid intake)  Answer Assessment - Initial Assessment Questions 1. DESCRIPTION: "Describe your dizziness."     Dizziness today  2. LIGHTHEADED: "Do you feel lightheaded?" (e.g., somewhat faint, woozy, weak upon standing)     Na  3. VERTIGO: "Do you feel like either you or the room is spinning or tilting?" (i.e. vertigo)     Denies  4. SEVERITY: "How bad is it?"  "Do you feel like you are going to faint?" "Can you stand and walk?"   - MILD: Feels slightly dizzy, but walking normally.   - MODERATE: Feels unsteady when walking, but not falling; interferes with normal activities (e.g., school, work).   - SEVERE: Unable to walk without falling, or requires assistance to walk without falling; feels like passing out now.      Mild  5. ONSET:   "When did the dizziness begin?"     Over the weekend 6. AGGRAVATING FACTORS: "Does anything make it worse?" (e.g., standing, change in head position)     Na  7. HEART RATE: "Can you tell me your heart rate?" "How many beats in 15 seconds?"  (Note: not all patients can do this)       na 8. CAUSE: "What do you think is causing the dizziness?"     Elevated B/P 9. RECURRENT SYMPTOM: "Have you had dizziness before?" If Yes, ask: "When was the last time?" "What happened that time?"     Na  10. OTHER SYMPTOMS: "Do you have any other symptoms?" (e.g., fever, chest pain, vomiting, diarrhea, bleeding)       Denies  11. PREGNANCY: "Is there any chance you are pregnant?" "When was your last menstrual period?"       na  Protocols used: Dizziness - Lightheadedness-A-AH

## 2021-04-29 NOTE — Telephone Encounter (Signed)
See other message

## 2021-04-29 NOTE — Telephone Encounter (Signed)
Please review.  The pt has an appointment already scheduled for 05/12/2021.  I think the triage note is with Dr. Rosanna Randy to review.     Thanks,   -Mickel Baas

## 2021-04-29 NOTE — Telephone Encounter (Signed)
Correct, a message was sent to Dr. Rosanna Randy concerning message below.

## 2021-04-29 NOTE — Telephone Encounter (Signed)
Patient called in stated that she is calling for Sharyn Lull who work with Dr Rosanna Randy per patient she have been calling since 04/28/21 asking for a call back at Ph# [phone]

## 2021-05-12 ENCOUNTER — Ambulatory Visit: Payer: 59 | Admitting: Family Medicine

## 2021-06-05 ENCOUNTER — Telehealth: Payer: Self-pay | Admitting: Family Medicine

## 2021-06-05 NOTE — Telephone Encounter (Signed)
Medication has been D/C from list.

## 2021-06-05 NOTE — Telephone Encounter (Signed)
Pt is calling to ask if Dr. Rosanna Randy can remove the ALPRAZolam Duanne Moron) 0.25 MG tablet [[id number]] from her chart. Pt reports that she is not taking it. Please advise

## 2021-07-01 ENCOUNTER — Encounter: Payer: Self-pay | Admitting: Family Medicine

## 2021-07-28 ENCOUNTER — Other Ambulatory Visit: Payer: Self-pay

## 2021-07-28 ENCOUNTER — Ambulatory Visit: Payer: 59 | Admitting: Obstetrics and Gynecology

## 2021-07-28 ENCOUNTER — Encounter: Payer: Self-pay | Admitting: Obstetrics and Gynecology

## 2021-07-28 VITALS — BP 150/58 | HR 65 | Ht 61.5 in | Wt 130.3 lb

## 2021-07-28 DIAGNOSIS — Z7689 Persons encountering health services in other specified circumstances: Secondary | ICD-10-CM

## 2021-07-28 DIAGNOSIS — Z780 Asymptomatic menopausal state: Secondary | ICD-10-CM

## 2021-07-28 DIAGNOSIS — E039 Hypothyroidism, unspecified: Secondary | ICD-10-CM

## 2021-07-28 DIAGNOSIS — Z1231 Encounter for screening mammogram for malignant neoplasm of breast: Secondary | ICD-10-CM | POA: Diagnosis not present

## 2021-07-28 DIAGNOSIS — M8580 Other specified disorders of bone density and structure, unspecified site: Secondary | ICD-10-CM

## 2021-07-28 DIAGNOSIS — Z01419 Encounter for gynecological examination (general) (routine) without abnormal findings: Secondary | ICD-10-CM

## 2021-07-28 DIAGNOSIS — Z1211 Encounter for screening for malignant neoplasm of colon: Secondary | ICD-10-CM | POA: Diagnosis not present

## 2021-07-28 DIAGNOSIS — I10 Essential (primary) hypertension: Secondary | ICD-10-CM

## 2021-07-28 NOTE — Progress Notes (Signed)
ANNUAL PREVENTATIVE CARE GYNECOLOGY  ENCOUNTER NOTE  Subjective:       Cynthia Blankenship is a 60 y.o. [phone] female here to establish care and for a routine annual gynecologic exam. The patient is sexually active. The patient has never been taking hormone replacement therapy. Patient denies post-menopausal vaginal bleeding. The patient wears seatbelts: yes (walking 3 days per week). The patient participates in regular exercise: yes. Has the patient ever been transfused or tattooed?: yes (remote h/o blood transfusion for anemia). The patient reports that there is not domestic violence in her life.  Current complaints: 1.  None    Gynecologic History No LMP recorded. Patient has had a hysterectomy. Contraception: status post hysterectomy Last Pap: 03/18/2015. Results were: normal.  Unsure if she still has her cervix.  Last mammogram: 06/24/2020. Results were: normal Last Colonoscopy: Is due.  Last Dexa Scan: 02/03/2012.  Results were: Osteopenia   Obstetric History OB History  Gravida Para Term Preterm AB Living  3 2 2   1 2   SAB IAB Ectopic Multiple Live Births          2    # Outcome Date GA Lbr Len/2nd Weight Sex Delivery Anes PTL Lv  3 Term 1984   5 lb (2.268 kg) F Vag-Spont   LIV  2 Term 1980   6 lb 0.4 oz (2.733 kg) M Vag-Spont  N LIV  1 AB 1977            Past Medical History:  Diagnosis Date   Hypertension    Thyroid condition     Family History  Problem Relation Age of Onset   Hypertension Mother    Alcohol abuse Father    Cirrhosis Father    Breast cancer Neg Hx     Past Surgical History:  Procedure Laterality Date   ABDOMINAL HYSTERECTOMY     TUBAL LIGATION      Social History   Socioeconomic History   Marital status: Married    Spouse name: Not on file   Number of children: Not on file   Years of education: Not on file   Highest education level: Not on file  Occupational History   Not on file  Tobacco Use   Smoking status: Former   Smokeless  tobacco: Never  Substance and Sexual Activity   Alcohol use: No    Alcohol/week: 0.0 standard drinks   Drug use: No   Sexual activity: Not on file  Other Topics Concern   Not on file  Social History Narrative   Not on file   Social Determinants of Health   Financial Resource Strain: Not on file  Food Insecurity: Not on file  Transportation Needs: Not on file  Physical Activity: Not on file  Stress: Not on file  Social Connections: Not on file  Intimate Partner Violence: Not on file    Current Outpatient Medications on File Prior to Visit  Medication Sig Dispense Refill   atenolol (TENORMIN) 50 MG tablet Take 1 tablet (50 mg total) by mouth daily. 90 tablet 3   levothyroxine (EUTHYROX) 75 MCG tablet Take 1 tablet (75 mcg total) by mouth daily before breakfast. 90 tablet 3   Multiple Vitamin (MULTIVITAMIN) tablet Take 1 tablet by mouth daily.     No current facility-administered medications on file prior to visit.    No Known Allergies    Review of Systems ROS Review of Systems - General ROS: negative for - chills, fatigue, fever, hot flashes,  night sweats, weight gain or weight loss Psychological ROS: negative for - anxiety, decreased libido, depression, mood swings, physical abuse or sexual abuse Ophthalmic ROS: negative for - blurry vision, eye pain or loss of vision ENT ROS: negative for - headaches, hearing change, visual changes or vocal changes Allergy and Immunology ROS: negative for - hives, itchy/watery eyes or seasonal allergies Hematological and Lymphatic ROS: negative for - bleeding problems, bruising, swollen lymph nodes or weight loss Endocrine ROS: negative for - galactorrhea, hair pattern changes, hot flashes, malaise/lethargy, mood swings, palpitations, polydipsia/polyuria, skin changes, temperature intolerance or unexpected weight changes Breast ROS: negative for - new or changing breast lumps or nipple discharge Respiratory ROS: negative for - cough or  shortness of breath Cardiovascular ROS: negative for - chest pain, irregular heartbeat, palpitations or shortness of breath Gastrointestinal ROS: no abdominal pain, change in bowel habits, or black or bloody stools Genito-Urinary ROS: no dysuria, trouble voiding, or hematuria Musculoskeletal ROS: negative for - joint pain or joint stiffness Neurological ROS: negative for - bowel and bladder control changes Dermatological ROS: negative for rash and skin lesion changes   Objective:   BP (!) 150/58   Pulse 65   Ht 5' 1.5" (1.562 m)   Wt 130 lb 4.8 oz (59.1 kg)   BMI 24.22 kg/m  CONSTITUTIONAL: Well-developed, well-nourished female in no acute distress.  PSYCHIATRIC: Normal mood and affect. Normal behavior. Normal judgment and thought content. [place]: Alert and oriented to person, place, and time. Normal muscle tone coordination. No cranial nerve deficit noted. HENT:  Normocephalic, atraumatic, External right and left ear normal. Oropharynx is clear and moist EYES: Conjunctivae and EOM are normal. Pupils are equal, round, and reactive to light. No scleral icterus.  NECK: Normal range of motion, supple, no masses.  Normal thyroid.  SKIN: Skin is warm and dry. No rash noted. Not diaphoretic. No erythema. No pallor. CARDIOVASCULAR: Normal heart rate noted, regular rhythm, no murmur. RESPIRATORY: Clear to auscultation bilaterally. Effort and breath sounds normal, no problems with respiration noted. BREASTS: Symmetric in size. No masses, skin changes, nipple drainage, or lymphadenopathy. ABDOMEN: Soft, normal bowel sounds, no distention noted.  No tenderness, rebound or guarding.  BLADDER: Normal PELVIC:  Bladder no bladder distension noted  Urethra: normal appearing urethra with no masses, tenderness or lesions  Vulva: normal appearing vulva with no masses, tenderness or lesions  Vagina: mild atrophy. No lesions or discharge present.   Cervix: surgically absent  Uterus: surgically  absent, vaginal cuff well healed  Adnexa: unable to palpate if present, no tenderness or masses.   RV: External Exam NormaI, No Rectal Masses, and Normal Sphincter tone  MUSCULOSKELETAL: Normal range of motion. No tenderness.  No cyanosis, clubbing, or edema.  2+ distal pulses. LYMPHATIC: No Axillary, Supraclavicular, or Inguinal Adenopathy.   Labs: Lab Results  Component Value Date   WBC 3.3 (L) 04/22/2020   HGB 13.5 04/22/2020   HCT 41.2 04/22/2020   MCV 88 04/22/2020   PLT 239 04/22/2020    Lab Results  Component Value Date   CREATININE 0.71 04/22/2020   BUN 10 04/22/2020   NA 140 04/22/2020   K 3.9 04/22/2020   CL 102 04/22/2020   CO2 22 04/22/2020    Lab Results  Component Value Date   ALT 14 04/22/2020   AST 17 04/22/2020   ALKPHOS 79 04/22/2020   BILITOT 0.7 04/22/2020    Lab Results  Component Value Date   CHOL 184 04/22/2020   HDL 57 04/22/2020  LDLCALC 113 (H) 04/22/2020   TRIG 78 04/22/2020   CHOLHDL 3.2 04/22/2020    Lab Results  Component Value Date   TSH 1.47 10/17/2020    No results found for: HGBA1C   Assessment:   1. Encounter to establish care with new doctor   2. Encounter for well woman exam with routine gynecological exam   3. Breast cancer screening by mammogram   4. Colon cancer screening   5. Osteopenia after menopause   6. Essential hypertension   7. Acquired hypothyroidism     Plan:  Pap: Not needed Mammogram: Ordered Colon screening:  Ordered Colonoscopy Labs:  None.  Has labs drawn by PCP.  Routine preventative health maintenance measures emphasized: Exercise/Diet/Weight control, Tobacco Warnings, Alcohol/Substance use risks, Stress Management, and Safe Sex Osteopenia, encouraged Vitamin D and calcium supplementation.  Hypothyroidism managed by Endocrinology.  HTN managed by PCP, taking Atenolol.  COVID Vaccination status: has completed 2-vaccine series. Is eligible for booster.  Flu vaccine up to date (received in  October).  Is due for Shingles vaccine. Encouraged to acquire.  Return to [place], MD [facility]

## 2021-07-29 LAB — COMPREHENSIVE METABOLIC PANEL
ALT: 18 IU/L (ref 0–32)
AST: 17 IU/L (ref 0–40)
Albumin/Globulin Ratio: 1.6 (ref 1.2–2.2)
Albumin: 4.5 g/dL (ref 3.8–4.9)
Alkaline Phosphatase: 83 IU/L (ref 44–121)
BUN/Creatinine Ratio: 15 (ref 12–28)
BUN: 11 mg/dL (ref 8–27)
Bilirubin Total: 0.4 mg/dL (ref 0.0–1.2)
CO2: 26 mmol/L (ref 20–29)
Calcium: 10 mg/dL (ref 8.7–10.3)
Chloride: 102 mmol/L (ref 96–106)
Creatinine, Ser: 0.72 mg/dL (ref 0.57–1.00)
Globulin, Total: 2.8 g/dL (ref 1.5–4.5)
Glucose: 94 mg/dL (ref 70–99)
Potassium: 4.3 mmol/L (ref 3.5–5.2)
Sodium: 139 mmol/L (ref 134–144)
Total Protein: 7.3 g/dL (ref 6.0–8.5)
eGFR: 96 mL/min/{1.73_m2} (ref >59)

## 2021-07-29 LAB — LIPID PANEL
Chol/HDL Ratio: 2.9 ratio (ref 0.0–4.4)
Cholesterol, Total: 168 mg/dL (ref 100–199)
HDL: 58 mg/dL (ref >39)
LDL Chol Calc (NIH): 100 mg/dL — ABNORMAL HIGH (ref 0–99)
Triglycerides: 47 mg/dL (ref 0–149)
VLDL Cholesterol Cal: 10 mg/dL (ref 5–40)

## 2021-07-29 LAB — CBC
Hematocrit: 39.3 % (ref 34.0–46.6)
Hemoglobin: 13.2 g/dL (ref 11.1–15.9)
MCH: 28.7 pg (ref 26.6–33.0)
MCHC: 33.6 g/dL (ref 31.5–35.7)
MCV: 85 fL (ref 79–97)
Platelets: 255 10*3/uL (ref 150–450)
RBC: 4.6 x10E6/uL (ref 3.77–5.28)
RDW: 12.4 % (ref 11.7–15.4)
WBC: 3.8 10*3/uL (ref 3.4–10.8)

## 2021-08-04 ENCOUNTER — Other Ambulatory Visit: Payer: Self-pay

## 2021-08-04 ENCOUNTER — Ambulatory Visit
Admission: RE | Admit: 2021-08-04 | Discharge: 2021-08-04 | Disposition: A | Discharge status: Home / Self Care | Payer: 59 | Source: Ambulatory Visit | Attending: Obstetrics and Gynecology | Admitting: Obstetrics and Gynecology

## 2021-08-04 DIAGNOSIS — Z1231 Encounter for screening mammogram for malignant neoplasm of breast: Secondary | ICD-10-CM | POA: Diagnosis not present

## 2021-08-07 ENCOUNTER — Other Ambulatory Visit: Payer: Self-pay

## 2021-08-07 MED ORDER — NA SULFATE-K SULFATE-MG SULF 17.5-3.13-1.6 GM/177ML PO SOLN: 1.0000 | Freq: Once | ORAL | 0 refills | Status: AC

## 2021-08-11 ENCOUNTER — Encounter: Payer: 59 | Admitting: Obstetrics and Gynecology

## 2021-09-01 ENCOUNTER — Encounter: Payer: 59 | Admitting: Obstetrics and Gynecology

## 2021-09-09 ENCOUNTER — Encounter
Admission: RE | Disposition: A | Discharge status: Home / Self Care | Payer: Self-pay | Source: Home / Self Care | Attending: Gastroenterology

## 2021-09-09 ENCOUNTER — Other Ambulatory Visit: Payer: Self-pay

## 2021-09-09 ENCOUNTER — Ambulatory Visit
Admission: RE | Admit: 2021-09-09 | Discharge: 2021-09-09 | Disposition: A | Discharge status: Home / Self Care | Payer: 59 | Attending: Gastroenterology | Admitting: Gastroenterology

## 2021-09-09 ENCOUNTER — Telehealth: Payer: Self-pay

## 2021-09-09 ENCOUNTER — Ambulatory Visit: Payer: 59 | Admitting: Anesthesiology

## 2021-09-09 ENCOUNTER — Encounter: Payer: Self-pay | Admitting: Gastroenterology

## 2021-09-09 DIAGNOSIS — K649 Unspecified hemorrhoids: Secondary | ICD-10-CM

## 2021-09-09 DIAGNOSIS — K648 Other hemorrhoids: Secondary | ICD-10-CM | POA: Insufficient documentation

## 2021-09-09 DIAGNOSIS — Z1211 Encounter for screening for malignant neoplasm of colon: Secondary | ICD-10-CM | POA: Diagnosis not present

## 2021-09-09 HISTORY — PX: COLONOSCOPY: SHX5424

## 2021-09-09 SURGERY — COLONOSCOPY
Anesthesia: General

## 2021-09-09 MED ORDER — PROPOFOL 500 MG/50ML IV EMUL
INTRAVENOUS | Status: DC | PRN
  Administered 2021-09-09: 140 ug/kg/min via INTRAVENOUS

## 2021-09-09 MED ORDER — DEXMEDETOMIDINE (PRECEDEX) IN NS 20 MCG/5ML (4 MCG/ML) IV SYRINGE
PREFILLED_SYRINGE | INTRAVENOUS | Status: DC | PRN
  Administered 2021-09-09: 8 ug via INTRAVENOUS

## 2021-09-09 MED ORDER — PROPOFOL 10 MG/ML IV BOLUS
INTRAVENOUS | Status: DC | PRN
  Administered 2021-09-09: 20 mg via INTRAVENOUS
  Administered 2021-09-09: 80 mg via INTRAVENOUS

## 2021-09-09 MED ORDER — SODIUM CHLORIDE 0.9 % IV SOLN
INTRAVENOUS | Status: DC
  Administered 2021-09-09: 08:00:00 1000 mL via INTRAVENOUS

## 2021-09-09 MED ORDER — LIDOCAINE HCL (CARDIAC) PF 100 MG/5ML IV SOSY
PREFILLED_SYRINGE | INTRAVENOUS | Status: DC | PRN
  Administered 2021-09-09: 60 mg via INTRAVENOUS

## 2021-09-09 NOTE — Op Note (Signed)
[facility] Gastroenterology Patient Name: Cynthia Blankenship Procedure Date: 09/09/2021 8:49 AM MRN: [id number] Account #: [id number] Date of Birth: 09/26/1960 Admit Type: Outpatient Age: 60 Room: [facility] ENDO ROOM 3 Gender: Female Note Status: Finalized Instrument Name: Jasper Riling [id number] Procedure:             Colonoscopy Indications:           Screening for colorectal malignant neoplasm Providers:             Raekwon Winkowski B. Bonna Gains MD, MD Referring MD:          Chesley Noon. Marcelline Mates (Referring MD) Medicines:             Monitored Anesthesia Care Complications:         No immediate complications. Procedure:             Pre-Anesthesia Assessment:                        - Prior to the procedure, a History and Physical was                         performed, and patient medications, allergies and                         sensitivities were reviewed. The patient's tolerance                         of previous anesthesia was reviewed.                        - The risks and benefits of the procedure and the                         sedation options and risks were discussed with the                         patient. All questions were answered and informed                         consent was obtained.                        - Patient identification and proposed procedure were                         verified prior to the procedure by the physician, the                         nurse, the anesthetist and the technician. The                         procedure was verified in the pre-procedure area in                         the procedure room in the endoscopy suite.                        - ASA Grade Assessment: II - A patient with mild  systemic disease.                        - After reviewing the risks and benefits, the patient                         was deemed in satisfactory condition to undergo the                         procedure.                         After obtaining informed consent, the colonoscope was                         passed under direct vision. Throughout the procedure,                         the patient's blood pressure, pulse, and oxygen                         saturations were monitored continuously. The                         Colonoscope was introduced through the anus and                         advanced to the the cecum, identified by appendiceal                         orifice and ileocecal valve. The colonoscopy was                         performed with ease. The patient tolerated the                         procedure well. The quality of the bowel preparation                         was good. Findings:      Hemorrhoids were found on perianal exam.      The rectum, sigmoid colon, descending colon, transverse colon, ascending       colon and cecum appeared normal.      The retroflexed view of the distal rectum and anal verge was normal and       showed no anal or rectal abnormalities.      Non-bleeding internal hemorrhoids were found during retroflexion. The       hemorrhoids were small.      Anal papilla(e) were hypertrophied. Impression:            - Hemorrhoids found on perianal exam.                        - The rectum, sigmoid colon, descending colon,                         transverse colon, ascending colon and cecum are normal.                        - The distal  rectum and anal verge are normal on                         retroflexion view.                        - Non-bleeding internal hemorrhoids.                        - Anal papilla(e) were hypertrophied.                        - No specimens collected. Recommendation:        - Refer to a surgeon large external hemorrhoids.                        - Discharge patient to home.                        - High fiber diet.                        - Continue present medications.                        - Repeat colonoscopy in 10 years for screening                          purposes.                        - Return to primary care physician as previously                         scheduled.                        - The findings and recommendations were discussed with                         the patient.                        - The findings and recommendations were discussed with                         the patient's family. Procedure Code(s):     --- Professional ---                        [phone], Colonoscopy, flexible; diagnostic, including                         collection of specimen(s) by brushing or washing, when                         performed (separate procedure) Diagnosis Code(s):     --- Professional ---                        Z12.11, Encounter for screening for malignant neoplasm  of colon CPT copyright 2019 American Medical Association. All rights reserved. The codes documented in this report are preliminary and upon coder review may  be revised to meet current compliance requirements.  Vonda Antigua, MD Margretta Sidle B. Bonna Gains MD, MD 09/09/2021 9:30:37 AM This report has been signed electronically. Number of Addenda: 0 Note Initiated On: 09/09/2021 8:49 AM Scope Withdrawal Time: 0 hours 15 minutes 25 seconds  Total Procedure Duration: 0 hours 20 minutes 36 seconds       [facility]

## 2021-09-09 NOTE — Anesthesia Preprocedure Evaluation (Signed)
Anesthesia Evaluation  Patient identified by MRN, date of birth, ID band Patient awake    Reviewed: Allergy & Precautions, NPO status , Patient's Chart, lab work & pertinent test results, reviewed documented beta blocker date and time   Airway Mallampati: II  TM Distance: >3 FB Neck ROM: Full    Dental no notable dental hx.    Pulmonary neg pulmonary ROS, former smoker,    Pulmonary exam normal        Cardiovascular hypertension, Pt. on medications and Pt. on home beta blockers negative cardio ROS Normal cardiovascular exam     Neuro/Psych negative neurological ROS  negative psych ROS   GI/Hepatic negative GI ROS, Neg liver ROS, Bowel prep,  Endo/Other  Hypothyroidism   Renal/GU negative Renal ROS  negative genitourinary   Musculoskeletal  (+) Arthritis ,   Abdominal   Peds negative pediatric ROS (+)  Hematology negative hematology ROS (+)   Anesthesia Other Findings   Reproductive/Obstetrics negative OB ROS                             Anesthesia Physical Anesthesia Plan  ASA: 2  Anesthesia Plan: General   Post-op Pain Management:    Induction: Intravenous  PONV Risk Score and Plan: 2 and Propofol infusion and TIVA  Airway Management Planned: Natural Airway and Nasal Cannula  Additional Equipment:   Intra-op Plan:   Post-operative Plan:   Informed Consent: I have reviewed the patients History and Physical, chart, labs and discussed the procedure including the risks, benefits and alternatives for the proposed anesthesia with the patient or authorized representative who has indicated his/her understanding and acceptance.       Plan Discussed with: CRNA, Anesthesiologist and Surgeon  Anesthesia Plan Comments:         Anesthesia Quick Evaluation

## 2021-09-09 NOTE — Transfer of Care (Signed)
Immediate Anesthesia Transfer of Care Note  Patient: Cynthia Blankenship  Procedure(s) Performed: COLONOSCOPY  Patient Location: PACU  Anesthesia Type:General  Level of Consciousness: awake, alert  and oriented  Airway & Oxygen Therapy: Patient Spontanous Breathing  Post-op Assessment: Report given to RN and Post -op Vital signs reviewed and stable  Post vital signs: Reviewed and stable  Last Vitals:  Vitals Value Taken Time  BP 111/67 09/09/21 0930  Temp    Pulse 69 09/09/21 0930  Resp 17 09/09/21 0930  SpO2 98 % 09/09/21 0930  Vitals shown include unvalidated device data.  Last Pain:  Vitals:   09/09/21 0803  TempSrc: Temporal  PainSc: 0-No pain         Complications: No notable events documented.

## 2021-09-09 NOTE — Anesthesia Postprocedure Evaluation (Signed)
Anesthesia Post Note  Patient: Cynthia Blankenship  Procedure(s) Performed: COLONOSCOPY  Patient location during evaluation: Phase II Anesthesia Type: General Level of consciousness: awake and alert, awake and oriented Pain management: pain level controlled Vital Signs Assessment: post-procedure vital signs reviewed and stable Respiratory status: spontaneous breathing, nonlabored ventilation and respiratory function stable Cardiovascular status: blood pressure returned to baseline and stable Postop Assessment: no apparent nausea or vomiting Anesthetic complications: no   No notable events documented.   Last Vitals:  Vitals:   09/09/21 0950 09/09/21 1000  BP: 107/66 138/78  Pulse: 63 62  Resp: 14 15  Temp:    SpO2: 98% 100%    Last Pain:  Vitals:   09/09/21 0930  TempSrc: Temporal  PainSc:                  Phill Mutter

## 2021-09-09 NOTE — H&P (Signed)
Cynthia Antigua, MD [address] [place], [place], [place], [address] [address], [place], [place], [place], [address] Phone: [phone]  Fax: [phone]  Primary Care Physician:  Jerrol Banana., MD   Pre-Procedure History & Physical: HPI:  Cynthia Blankenship is a 60 y.o. female is here for a colonoscopy.   Past Medical History:  Diagnosis Date   Hypertension    Thyroid condition     Past Surgical History:  Procedure Laterality Date   ABDOMINAL HYSTERECTOMY     TUBAL LIGATION      Prior to Admission medications   Medication Sig Start Date End Date Taking? Authorizing Provider  atenolol (TENORMIN) 50 MG tablet Take 1 tablet (50 mg total) by mouth daily. 01/29/21  Yes Jerrol Banana., MD  levothyroxine (EUTHYROX) 75 MCG tablet Take 1 tablet (75 mcg total) by mouth daily before breakfast. 10/17/20  Yes Shamleffer, Melanie Crazier, MD  Multiple Vitamin (MULTIVITAMIN) tablet Take 1 tablet by mouth daily.   Yes [provider]    Allergies as of 08/05/2021   (No Known Allergies)    Family History  Problem Relation Age of Onset   Hypertension Mother    Alcohol abuse Father    Cirrhosis Father    Hypertension Sister    Hypertension Sister    Hypertension Sister    Kidney failure Sister    Hypertension Brother    Hypertension Brother    Breast cancer Neg Hx     Social History   Socioeconomic History   Marital status: Married    Spouse name: Not on file   Number of children: Not on file   Years of education: Not on file   Highest education level: Not on file  Occupational History   Not on file  Tobacco Use   Smoking status: Former   Smokeless tobacco: Never  Vaping Use   Vaping Use: Never used  Substance and Sexual Activity   Alcohol use: No    Alcohol/week: 0.0 standard drinks   Drug use: No   Sexual activity: Yes  Other Topics Concern   Not on file  Social History Narrative   Not on file   Social Determinants of Health    Financial Resource Strain: Not on file  Food Insecurity: Not on file  Transportation Needs: Not on file  Physical Activity: Not on file  Stress: Not on file  Social Connections: Not on file  Intimate Partner Violence: Not on file    Review of Systems: See HPI, otherwise negative ROS  Physical Exam: Constitutional: General:   Alert,  Well-developed, well-nourished, pleasant and cooperative in NAD BP (!) 173/76    Pulse 82    Temp (!) 97.5 F (36.4 C) (Temporal)    Resp 16    Ht 5' (1.524 m)    Wt 58.2 kg    SpO2 100%    BMI 25.05 kg/m   Head: Normocephalic, atraumatic.   Eyes:  Sclera clear, no icterus.   Conjunctiva pink.   Mouth:  No deformity or lesions, oropharynx pink & moist.  Neck:  Supple, trachea midline  Respiratory: Normal respiratory effort  Gastrointestinal:  Soft, non-tender and non-distended without masses, hepatosplenomegaly or hernias noted.  No guarding or rebound tenderness.     Cardiac: No clubbing or edema.  No cyanosis. Normal posterior tibial pedal pulses noted.  Lymphatic:  No significant cervical adenopathy.  Psych:  Alert and cooperative. Normal mood and affect.  Musculoskeletal:   Symmetrical without  gross deformities. 5/5 Lower extremity strength bilaterally.  Skin: Warm. Intact without significant lesions or rashes. No jaundice.  Neurologic:  Face symmetrical, tongue midline, Normal sensation to touch;  grossly normal neurologically.  Psych:  Alert and oriented x3, Alert and cooperative. Normal mood and affect.  Impression/Plan: Cynthia Blankenship is here for a colonoscopy to be performed for average risk screening.  Risks, benefits, limitations, and alternatives regarding  colonoscopy have been reviewed with the patient.  Questions have been answered.  All parties agreeable.   Virgel Manifold, MD  09/09/2021, 8:16 AM

## 2021-09-09 NOTE — Telephone Encounter (Signed)
-----   Message from Virgel Manifold, MD sent at 09/09/2021  9:35 AM EST ----- Please refer to Dr. Hampton Abbot for large external hemorrhoids

## 2021-09-09 NOTE — Telephone Encounter (Signed)
Referral placed.

## 2021-09-10 ENCOUNTER — Encounter: Payer: Self-pay | Admitting: Gastroenterology

## 2021-09-23 ENCOUNTER — Ambulatory Visit: Payer: 59 | Admitting: Surgery

## 2021-09-25 ENCOUNTER — Other Ambulatory Visit: Payer: Self-pay

## 2021-09-25 ENCOUNTER — Ambulatory Visit: Payer: 59 | Admitting: Surgery

## 2021-09-25 ENCOUNTER — Encounter: Payer: Self-pay | Admitting: Surgery

## 2021-09-25 VITALS — BP 153/84 | HR 76 | Temp 98.7°F | Ht 60.0 in | Wt 129.0 lb

## 2021-09-25 DIAGNOSIS — K648 Other hemorrhoids: Secondary | ICD-10-CM | POA: Diagnosis not present

## 2021-09-25 DIAGNOSIS — K644 Residual hemorrhoidal skin tags: Secondary | ICD-10-CM

## 2021-09-25 NOTE — Progress Notes (Signed)
09/25/2021  Reason for Visit:  External hemorrhoids  Requesting Provider:  Vonda Antigua, MD  History of Present Illness: Cynthia Blankenship is a 61 y.o. female presenting for evaluation of external hemorrhoids.  The patient had a recent colonoscopy with Dr. Bonna Gains on 09/09/21 for screening purposes.  Right before colonoscopy was done, external exam revealed enlarged external hemorrhoids she was referred to surgical team for further evaluation.  The patient reports that prior to the colonoscopy, she denies having any recent episodes of perianal pain or flareup of the hemorrhoids.  She has had episodes in the past but it has been a long time since any issues.  After the colonoscopy, there was no other flareup issues either.  She does unfortunately history of constipation and reports that she has a bowel movement at most every other day.  She also does spend a prolonged time on the toilet seat for bowel movements as the stool does not come up right away.  Denies any blood in the stool, pain with the bowel movement itself, or abdominal pain.  Past Medical History: Past Medical History:  Diagnosis Date   Hypertension    Thyroid condition      Past Surgical History: Past Surgical History:  Procedure Laterality Date   ABDOMINAL HYSTERECTOMY     COLONOSCOPY N/A 09/09/2021   Procedure: COLONOSCOPY;  Surgeon: Virgel Manifold, MD;  Location: [facility] ENDOSCOPY;  Service: Endoscopy;  Laterality: N/A;   TUBAL LIGATION      Home Medications: Prior to Admission medications   Medication Sig Start Date End Date Taking? Authorizing Provider  atenolol (TENORMIN) 50 MG tablet Take 1 tablet (50 mg total) by mouth daily. 01/29/21  Yes Jerrol Banana., MD  levothyroxine (EUTHYROX) 75 MCG tablet Take 1 tablet (75 mcg total) by mouth daily before breakfast. 10/17/20  Yes Shamleffer, Melanie Crazier, MD  Multiple Vitamin (MULTIVITAMIN) tablet Take 1 tablet by mouth daily.   Yes [provider]    Allergies: No Known Allergies  Social History:  reports that she has quit smoking. She has never used smokeless tobacco. She reports that she does not drink alcohol and does not use drugs.   Family History: Family History  Problem Relation Age of Onset   Hypertension Mother    Alcohol abuse Father    Cirrhosis Father    Hypertension Sister    Hypertension Sister    Hypertension Sister    Kidney failure Sister    Hypertension Brother    Hypertension Brother    Breast cancer Neg Hx     Review of Systems: Review of Systems  Constitutional:  Negative for chills and fever.  HENT:  Negative for hearing loss.   Respiratory:  Negative for shortness of breath.   Cardiovascular:  Negative for chest pain.  Gastrointestinal:  Positive for constipation. Negative for abdominal pain, blood in stool, nausea and vomiting.  Genitourinary:  Negative for dysuria.  Musculoskeletal:  Negative for myalgias.  Skin:  Negative for rash.  Neurological:  Negative for dizziness.  Psychiatric/Behavioral:  Negative for depression.    Physical Exam BP (!) 153/84    Pulse 76    Temp 98.7 F (37.1 C) (Other (Comment))    Ht 5' (1.524 m)    Wt 129 lb (58.5 kg)    SpO2 99%    BMI 25.19 kg/m  CONSTITUTIONAL: No acute distress, well-nourished HEENT:  Normocephalic, atraumatic, extraocular motion intact. NECK: Trachea is midline, and there is no jugular venous distension.  RESPIRATORY:  Lungs are clear, and breath sounds are equal bilaterally. Normal respiratory effort without pathologic use of accessory muscles. CARDIOVASCULAR: Heart is regular without murmurs, gallops, or rubs. GI: The abdomen is soft, nondistended, nontender.  RECTAL: External exam reveals enlarged external hemorrhoids of all 3 columns but without any tenderness, inflammation, induration, or thrombosis.  Digital rectal exam reveals mildly enlarged right posterior colon and the other 2 are only minimally enlarged without any gross  blood on the glove. MUSCULOSKELETAL:  Normal muscle strength and tone in all four extremities.  No peripheral edema or cyanosis. SKIN: Skin turgor is normal. There are no pathologic skin lesions.  NEUROLOGIC:  Motor and sensation is grossly normal.  Cranial nerves are grossly intact. PSYCH:  Alert and oriented to person, place and time. Affect is normal.  Laboratory Analysis: Labs from 07/28/2021: Sodium 139, potassium 4.3, chloride 102, CO2 26, BUN 11, creatinine 0.72, total bilirubin 0.4, AST 17, ALT 18, alkaline phosphatase 83, albumin 4.5.  WBC 3.8, hemoglobin 13.2, hematocrit 29.3, platelets 255.  Imaging: No results found.  Assessment and Plan: This is a 61 y.o. female with enlarged external hemorrhoids and only somewhat enlarged internal hemorrhoids.  - The patient reports a chronic constipation history and has had enlarged hemorrhoids for many years.  However she reports that these are not causing any troubles currently or recently.  She denies any perianal pain and denies any itching after bowel movements during the day.  Discussed with her that at this point, I do not see any need for surgical intervention.  However, we do have to improve her constipation in order to decrease risk of possible flareups in the future.  Discussed with her increasing her fiber intake either in the form of higher fiber content food versus fiber supplement using Metamucil or Benefiber.  Also discussed with her adding possibly MiraLAX as a gentle laxative if fiber alone is not enough.  The goal is to have a soft daily bowel movement without significant straining.  Discussed with her also toilet habits with decreasing the amount of time sitting on the toilet seat in order to decrease the amount of pressure on the hemorrhoid tissue while sitting on the toilet.  If she were to have any episodes of flareups, she knows to call us so we can see her and treat her appropriately.  Discussed with her that sometimes these  are treated just with Anusol ointment or suppositories although sometimes patients require I&D in the case of thrombosed hemorrhoids. - Patient understands this plan, and all of her questions have been answered. -Follow-up as needed.  I spent 40 minutes dedicated to the care of this patient on the date of this encounter to include pre-visit review of records, face-to-face time with the patient discussing diagnosis and management, and any post-visit coordination of care.   Melvyn Neth, [facility]

## 2021-09-25 NOTE — Patient Instructions (Addendum)
Please call our office if you have questions or concerns.   You may try Benefiber or Metamucil. You will also need to increase your fluid intake daily.  You may try Miralax daily. This can be purchased at [place] or any drug store.    How to Take a [employer] A sitz bath is a warm water bath that may be used to care for your rectum, genital area, or the area between your rectum and genitals (perineum). In a sitz bath, the water only comes up to your hips and covers your buttocks. A sitz bath may be done in a bathtub or with a portable sitz bath that fits over the toilet. Your health care provider may recommend a sitz bath to help: Relieve pain and discomfort after delivering a baby. Relieve pain and itching from hemorrhoids or anal fissures. Relieve pain after certain surgeries. Relax muscles that are sore or tight. How to take a sitz bath Take 3-4 sitz baths a day, or as many as told by your health care provider. Bathtub sitz bath To take a sitz bath in a bathtub: Partially fill a bathtub with warm water. The water should be deep enough to cover your hips and buttocks when you are sitting in the tub. Follow your health care provider's instructions if you are told to put medicine in the water. Sit in the water. Open the tub drain a little, and leave it open during your bath. Turn on the warm water again, enough to replace the water that is draining out. Keep the water running throughout your bath. This helps keep the water at the right level and temperature. Soak in the water for 15-20 minutes, or as long as told by your health care provider. When you are done, be careful when you stand up. You may feel dizzy. After the sitz bath, pat yourself dry. Do not rub your skin to dry it.  Over-the-toilet sitz bath To take a sitz bath with an over-the-toilet basin: Follow the manufacturer's instructions. Fill the basin with warm water. Follow your health care provider's instructions if you  were told to put medicine in the water. Sit on the seat. Make sure the water covers your buttocks and perineum. Soak in the water for 15-20 minutes, or as long as told by your health care provider. After the sitz bath, pat yourself dry. Do not rub your skin to dry it. Clean and dry the basin between uses. Discard the basin if it cracks, or according to the manufacturer's instructions.  Contact a health care provider if: Your pain or itching gets worse. Do not continue with sitz baths if your symptoms get worse. You have new symptoms. Do not continue with sitz baths until you talk with your health care provider. Summary A sitz bath is a warm water bath in which the water only comes up to your hips and covers your buttocks. A sitz bath may help relieve pain and discomfort after delivering a baby. It also may help with pain and itching from hemorrhoids or anal fissures, or pain after certain surgeries. It can also help to relax muscles that are sore or tight. Take 3-4 sitz baths a day, or as many as told by your health care provider. Soak in the water for 15-20 minutes. Do not continue with sitz baths if your symptoms get worse. This information is not intended to replace advice given to you by your health care provider. Make sure you discuss any questions you have with  your health care provider. Document Revised: 05/15/2020 Document Reviewed: 05/15/2020 Elsevier Patient Education  2022 [place]. Constipation, Adult Constipation is when a person has fewer than three bowel movements in a week, has difficulty having a bowel movement, or has stools (feces) that are dry, hard, or larger than normal. Constipation may be caused by an underlying condition. It may become worse with age if a person takes certain medicines and does not take in enough fluids. Follow these instructions at home: Eating and drinking  Eat foods that have a lot of fiber, such as beans, whole grains, and fresh fruits and  vegetables. Limit foods that are low in fiber and high in fat and processed sugars, such as fried or sweet foods. These include french fries, hamburgers, cookies, candies, and soda. Drink enough fluid to keep your urine pale yellow. General instructions Exercise regularly or as told by your health care provider. Try to do 150 minutes of moderate exercise each week. Use the bathroom when you have the urge to go. Do not hold it in. Take over-the-counter and prescription medicines only as told by your health care provider. This includes any fiber supplements. During bowel movements: Practice deep breathing while relaxing the lower abdomen. Practice pelvic floor relaxation. Watch your condition for any changes. Let your health care provider know about them. Keep all follow-up visits as told by your health care provider. This is important. Contact a health care provider if: You have pain that gets worse. You have a fever. You do not have a bowel movement after 4 days. You vomit. You are not hungry or you lose weight. You are bleeding from the opening between the buttocks (anus). You have thin, pencil-like stools. Get help right away if: You have a fever and your symptoms suddenly get worse. You leak stool or have blood in your stool. Your abdomen is bloated. You have severe pain in your abdomen. You feel dizzy or you faint. Summary Constipation is when a person has fewer than three bowel movements in a week, has difficulty having a bowel movement, or has stools (feces) that are dry, hard, or larger than normal. Eat foods that have a lot of fiber, such as beans, whole grains, and fresh fruits and vegetables. Drink enough fluid to keep your urine pale yellow. Take over-the-counter and prescription medicines only as told by your health care provider. This includes any fiber supplements. This information is not intended to replace advice given to you by your health care provider. Make sure you  discuss any questions you have with your health care provider. Document Revised: 07/18/2019 Document Reviewed: 07/18/2019 Elsevier Patient Education  2022 [place].   Hemorrhoids Hemorrhoids are swollen veins in and around the rectum or anus. There are two types of hemorrhoids: Internal hemorrhoids. These occur in the veins that are just inside the rectum. They may poke through to the outside and become irritated and painful. External hemorrhoids. These occur in the veins that are outside the anus and can be felt as a painful swelling or hard lump near the anus. Most hemorrhoids do not cause serious problems, and they can be managed with home treatments such as diet and lifestyle changes. If home treatments do not help the symptoms, procedures can be done to shrink or remove the hemorrhoids. What are the causes? This condition is caused by increased pressure in the anal area. This pressure may result from various things, including: Constipation. Straining to have a bowel movement. Diarrhea. Pregnancy. Obesity. Sitting for  long periods of time. Heavy lifting or other activity that causes you to strain. Anal sex. Riding a bike for a long period of time. What are the signs or symptoms? Symptoms of this condition include: Pain. Anal itching or irritation. Rectal bleeding. Leakage of stool (feces). Anal swelling. One or more lumps around the anus. How is this diagnosed? This condition can often be diagnosed through a visual exam. Other exams or tests may also be done, such as: An exam that involves feeling the rectal area with a gloved hand (digital rectal exam). An exam of the anal canal that is done using a small tube (anoscope). A blood test, if you have lost a significant amount of blood. A test to look inside the colon using a flexible tube with a camera on the end (sigmoidoscopy or colonoscopy). How is this treated? This condition can usually be treated at home. However,  various procedures may be done if dietary changes, lifestyle changes, and other home treatments do not help your symptoms. These procedures can help make the hemorrhoids smaller or remove them completely. Some of these procedures involve surgery, and others do not. Common procedures include: Rubber band ligation. Rubber bands are placed at the base of the hemorrhoids to cut off their blood supply. Sclerotherapy. Medicine is injected into the hemorrhoids to shrink them. Infrared coagulation. A type of light energy is used to get rid of the hemorrhoids. Hemorrhoidectomy surgery. The hemorrhoids are surgically removed, and the veins that supply them are tied off. Stapled hemorrhoidopexy surgery. The surgeon staples the base of the hemorrhoid to the rectal wall. Follow these instructions at home: Eating and drinking  Eat foods that have a lot of fiber in them, such as whole grains, beans, nuts, fruits, and vegetables. Ask your health care provider about taking products that have added fiber (fiber supplements). Reduce the amount of fat in your diet. You can do this by eating low-fat dairy products, eating less red meat, and avoiding processed foods. Drink enough fluid to keep your urine pale yellow. Managing pain and swelling  Take warm sitz baths for 20 minutes, 3-4 times a day to ease pain and discomfort. You may do this in a bathtub or using a portable sitz bath that fits over the toilet. If directed, apply ice to the affected area. Using ice packs between sitz baths may be helpful. Put ice in a plastic bag. Place a towel between your skin and the bag. Leave the ice on for 20 minutes, 2-3 times a day. General instructions Take over-the-counter and prescription medicines only as told by your health care provider. Use medicated creams or suppositories as told. Get regular exercise. Ask your health care provider how much and what kind of exercise is best for you. In general, you should do moderate  exercise for at least 30 minutes on most days of the week (150 minutes each week). This can include activities such as walking, biking, or yoga. Go to the bathroom when you have the urge to have a bowel movement. Do not wait. Avoid straining to have bowel movements. Keep the anal area dry and clean. Use wet toilet paper or moist towelettes after a bowel movement. Do not sit on the toilet for long periods of time. This increases blood pooling and pain. Keep all follow-up visits as told by your health care provider. This is important. Contact a health care provider if you have: Increasing pain and swelling that are not controlled by treatment or medicine. Difficulty having  a bowel movement, or you are unable to have a bowel movement. Pain or inflammation outside the area of the hemorrhoids. Get help right away if you have: Uncontrolled bleeding from your rectum. Summary Hemorrhoids are swollen veins in and around the rectum or anus. Most hemorrhoids can be managed with home treatments such as diet and lifestyle changes. Taking warm sitz baths can help ease pain and discomfort. In severe cases, procedures or surgery can be done to shrink or remove the hemorrhoids. This information is not intended to replace advice given to you by your health care provider. Make sure you discuss any questions you have with your health care provider. Document Revised: 03/11/2021 Document Reviewed: 03/11/2021 Elsevier Patient Education  [place]. [employer] Content in Foods Fiber is a substance that is found in plant foods, such as fruits, vegetables, whole grains, nuts, seeds, and beans. As part of your treatment and recovery plan, your health care provider may recommend that you eat foods that have specific amounts of dietary fiber. Some conditions may require a high-fiber diet while others may require a low-fiber diet. This sheet gives you information about the dietary fiber content of some common foods. Your  health care provider will tell you how much fiber you need in your diet. If you have problems or questions, contact your health care provider or dietitian. What foods are high in fiber? Fruits Blackberries or raspberries (fresh) --  cup (75 g) has 4 g of fiber. Pear (fresh) -- 1 medium (180 g) has 5.5 g of fiber. Prunes (dried) -- 6 to 8 pieces (57-76 g) has 5 g of fiber. Apple with skin -- 1 medium (182 g) has 4.8 g of fiber. Guava -- 1 cup (128 g) has 8.9 g of fiber. Vegetables Peas (frozen) --  cup (80 g) has 4.4 g of fiber. Potato with skin (baked) -- 1 medium (173 g) has 4.4 g of fiber. Pumpkin (canned) --  cup (122 g) has 5 g of fiber. Brussels sprouts (cooked) --  cup (78 g) has 4 g of fiber. Sweet potato --  cup mashed (124 g) has 4 g of fiber. Winter squash -- 1 cup cooked (205 g) has 5.7 g of fiber. Grains Bran cereal --  cup (31 g) has 8.6 g of fiber. Bulgur (cooked) --  cup (70 g) has 4 g of fiber. Quinoa (cooked) -- 1 cup (185 g) has 5.2 g of fiber. Popcorn -- 3 cups (375 g) popped has 5.8 g of fiber. Spaghetti, whole wheat -- 1 cup (140 g) has 6 g of fiber. Meats and other proteins Pinto beans (cooked) --  cup (90 g) has 7.7 g of fiber. Lentils (cooked) --  cup (90 g) has 7.8 g of fiber. Kidney beans (canned) --  cup (92.5 g) has 5.7 g of fiber. Soybeans (canned, frozen, or fresh) --  cup (92.5 g) has 5.2 g of fiber. Baked beans, plain or vegetarian (canned) --  cup (130 g) has 5.2 g of fiber. Garbanzo beans or chickpeas (canned) --  cup (90 g) has 6.6 g of fiber. Black beans (cooked) --  cup (86 g) has 7.5 g of fiber. White beans or navy beans (cooked) --  cup (91 g) has 9.3 g of fiber. The items listed above may not be a complete list of foods with high fiber. Actual amounts of fiber may be different depending on processing. Contact a dietitian for more information. What foods are moderate in fiber? Fruits Banana --  1 medium (126 g) has 3.2 g of  fiber. Melon -- 1 cup (155 g) has 1.4 g of fiber. Orange -- 1 small (154 g) has 3.7 g of fiber. Raisins --  cup (40 g) has 1.8 g of fiber. Applesauce, sweetened --  cup (125 g) has 1.5 g of fiber. Blueberries (fresh) --  cup (75 g) has 1.8 g of fiber. Strawberries (fresh, sliced) -- 1 cup (809 g) has 3 g of fiber. Cherries -- 1 cup (140 g) has 2.9 g of fiber. Vegetables Broccoli (cooked) --  cup (77.5 g) has 2.1 g of fiber. Carrots (cooked) --  cup (77.5 g) has 2.2 g of fiber. Corn (canned or frozen) --  cup (82.5 g) has 2.1 g of fiber. Potatoes, mashed --  cup (105 g) has 1.6 g of fiber. Tomato -- 1 medium (62 g) has 1.5 g of fiber. Green beans (canned) --  cup (83 g) has 2 g of fiber. Squash, winter --  cup (58 g) has 1 g of fiber. Sweet potato, baked -- 1 medium (150 g) has 3 g of fiber. Cauliflower (cooked) -- 1/2 cup (90 g) has 2.3 g of fiber. Grains Long-grain brown rice (cooked) -- 1 cup (196 g) has 3.5 g of fiber. Bagel, plain -- one 4-inch (10 cm) bagel has 2 g of fiber. Instant oatmeal --  cup (120 g) has about 2 g of fiber. Macaroni noodles, enriched (cooked) -- 1 cup (140 g) has 2.5 g of fiber. Multigrain cereal --  cup (15 g) has about 2-4 g of fiber. Whole-wheat bread -- 1 slice (26 g) has 2 g of fiber. Whole-wheat spaghetti noodles --  cup (70 g) has 3.2 g of fiber. Corn tortilla -- one 6-inch (15 cm) tortilla has 1.5 g of fiber. Meats and other proteins Almonds --  cup or 1 oz (28 g) has 3.5 g of fiber. Sunflower seeds in shell --  cup or  oz (11.5 g) has 1.1 g of fiber. Vegetable or soy patty -- 1 patty (70 g) has 3.4 g of fiber. Walnuts --  cup or 1 oz (30 g) has 2 g of fiber. Flax seed -- 1 Tbsp (7 g) has 2.8 g of fiber. The items listed above may not be a complete list of foods that have moderate amounts of fiber. Actual amounts of fiber may be different depending on processing. Contact a dietitian for more information. What foods are low in  fiber? Low-fiber foods contain less than 1 g of fiber per serving. They include: Fruits Fruit juice --  cup or 4 fl oz (118 mL) has 0.5 g of fiber. Vegetables Lettuce -- 1 cup (35 g) has 0.5 g of fiber. Cucumber (slices) --  cup (60 g) has 0.3 g of fiber. Celery -- 1 stalk (40 g) has 0.1 g of fiber. Grains Flour tortilla -- one 6-inch (15 cm) tortilla has 0.5 g of fiber. White rice (cooked) --  cup (81.5 g) has 0.3 g of fiber. Meats and other proteins Egg -- 1 large (50 g) has 0 g of fiber. Meat, poultry, or fish -- 3 oz (85 g) has 0 g of fiber. Dairy Milk -- 1 cup or 8 fl oz (237 mL) has 0 g of fiber. Yogurt -- 1 cup (245 g) has 0 g of fiber. The items listed above may not be a complete list of foods that are low in fiber. Actual amounts of fiber may be different depending on processing.  Contact a dietitian for more information. Summary Fiber is a substance that is found in plant foods, such as fruits, vegetables, whole grains, nuts, seeds, and beans. As part of your treatment and recovery plan, your health care provider may recommend that you eat foods that have specific amounts of dietary fiber. This information is not intended to replace advice given to you by your health care provider. Make sure you discuss any questions you have with your health care provider. Document Revised: 01/03/2020 Document Reviewed: 01/03/2020 Elsevier Patient Education  2022 [employer].

## 2021-10-19 ENCOUNTER — Encounter: Payer: Self-pay | Admitting: Internal Medicine

## 2021-10-19 ENCOUNTER — Ambulatory Visit: Payer: 59 | Admitting: Internal Medicine

## 2021-10-19 ENCOUNTER — Other Ambulatory Visit: Payer: Self-pay

## 2021-10-19 VITALS — BP 140/82 | HR 66 | Ht 60.0 in | Wt 130.0 lb

## 2021-10-19 DIAGNOSIS — E039 Hypothyroidism, unspecified: Secondary | ICD-10-CM | POA: Diagnosis not present

## 2021-10-19 LAB — TSH: TSH: 0.71 u[IU]/mL (ref 0.35–5.50)

## 2021-10-19 MED ORDER — LEVOTHYROXINE SODIUM 75 MCG PO TABS: 75.0000 ug | ORAL_TABLET | Freq: Every day | ORAL | 3 refills | Status: DC

## 2021-10-19 NOTE — Progress Notes (Signed)
Name: Cynthia Blankenship  MRN/ DOB: [id number], July 16, 1961    Age/ Sex: 61 y.o., female     PCP: Jerrol Banana., MD   Reason for Endocrinology Evaluation: Hypothyroidism     Initial Endocrinology Clinic Visit: 06/23/2020    PATIENT IDENTIFIER: Cynthia Blankenship is a 61 y.o., female with a past medical history of Hypothyroidism.  She has followed with [facility] since 06/23/2020 for consultative assistance with management of her hypothyroidism.   HISTORICAL SUMMARY:   She has been diagnosed with hypothyroidism for years and has been on Lt-4 replacement.    On her initial visit to our clinic she was on  Levothyroxine 75 mcg daily  Prior to that she was on 100 mcg daily, but had a TSH 0.016 uIU/mL      No FH of thyroid disease    SUBJECTIVE:     Today (10/19/2021):  Cynthia Blankenship is here for hypothyroidism.   Weight has been stable  She has recent colonoscopy , on mira lax which has helped with constipation  She is not sure about palpitations  Hand carpal tunnel and joint swelling  No local neck swelling     No biotin    Levothyroxine 75 mcg daily    HISTORY:  Past Medical History:  Past Medical History:  Diagnosis Date   Hypertension    Thyroid condition    Past Surgical History:  Past Surgical History:  Procedure Laterality Date   ABDOMINAL HYSTERECTOMY     COLONOSCOPY N/A 09/09/2021   Procedure: COLONOSCOPY;  Surgeon: Virgel Manifold, MD;  Location: [facility] ENDOSCOPY;  Service: Endoscopy;  Laterality: N/A;   TUBAL LIGATION     Social History:  reports that she has quit smoking. She has never used smokeless tobacco. She reports that she does not drink alcohol and does not use drugs. Family History:  Family History  Problem Relation Age of Onset   Hypertension Mother    Alcohol abuse Father    Cirrhosis Father    Hypertension Sister    Hypertension Sister    Hypertension Sister    Kidney failure Sister    Hypertension Brother     Hypertension Brother    Breast cancer Neg Hx      HOME MEDICATIONS: Allergies as of 10/19/2021   No Known Allergies      Medication List        Accurate as of October 19, 2021  7:09 AM. If you have any questions, ask your nurse or doctor.          atenolol 50 MG tablet Commonly known as: TENORMIN Take 1 tablet (50 mg total) by mouth daily.   levothyroxine 75 MCG tablet Commonly known as: Euthyrox Take 1 tablet (75 mcg total) by mouth daily before breakfast.   multivitamin tablet Take 1 tablet by mouth daily.          OBJECTIVE:   PHYSICAL EXAM: VS: There were no vitals taken for this visit.   EXAM: General: Pt appears well and is in NAD  Neck: General: Supple without adenopathy. Thyroid: Thyroid size normal.  No goiter or nodules appreciated. No thyroid bruit.  Lungs: Clear with good BS bilat with no rales, rhonchi, or wheezes  Heart: Auscultation: RRR.  Abdomen: Normoactive bowel sounds, soft, nontender, without masses or organomegaly palpable  Extremities:  BL LE: No pretibial edema normal ROM and strength.  Mental Status: Judgment, insight: Intact Memory: Intact for recent and remote events Mood and  affect: No depression, anxiety, or agitation     DATA REVIEWED:   Latest Reference Range & Units 10/19/21 07:48  TSH 0.35 - 5.50 uIU/mL 0.71      ASSESSMENT / PLAN / RECOMMENDATIONS:   Hypothyroidism  - Pt is clinically euthyroid  - No local neck symptoms  - Pt educated extensively on the correct way to take levothyroxine (first thing in the morning with water, 30 minutes before eating or taking other medications). - Pt encouraged to double dose the following day if she were to miss a dose given long half-life of levothyroxine.      Medications   Continue Levothyroxine 75 mcg daily    F/U in 1 yr , pt may return to f/u with PCP  Signed electronically by: Mack Guise, MD  [facility]  [address] [place], [place] [address] Phone: [phone] FAX: [phone]      CC: Jerrol Banana., MD [address] [place] [address] Phone: [phone]  Fax: [phone]   Return to Endocrinology clinic as below: Future Appointments  Date Time Provider [place]  10/19/2021  7:30 AM Marquan Vokes, Melanie Crazier, MD [facility] None

## 2021-10-19 NOTE — Patient Instructions (Signed)

## 2021-12-07 ENCOUNTER — Encounter: Payer: Self-pay | Admitting: Physician Assistant

## 2021-12-07 ENCOUNTER — Other Ambulatory Visit: Payer: Self-pay

## 2021-12-07 ENCOUNTER — Ambulatory Visit: Payer: 59 | Admitting: Physician Assistant

## 2021-12-07 VITALS — BP 152/71 | HR 91 | Ht 61.0 in | Wt 131.4 lb

## 2021-12-07 DIAGNOSIS — N3001 Acute cystitis with hematuria: Secondary | ICD-10-CM | POA: Diagnosis not present

## 2021-12-07 DIAGNOSIS — R319 Hematuria, unspecified: Secondary | ICD-10-CM

## 2021-12-07 LAB — POCT URINALYSIS DIPSTICK
Bilirubin, UA: NEGATIVE
Glucose, UA: NEGATIVE
Ketones, UA: NEGATIVE
Nitrite, UA: NEGATIVE
Protein, UA: POSITIVE — AB
Spec Grav, UA: 1.02 (ref 1.010–1.025)
Urobilinogen, UA: >=8 E.U./dL — AB
pH, UA: 6 (ref 5.0–8.0)

## 2021-12-07 MED ORDER — SULFAMETHOXAZOLE-TRIMETHOPRIM 800-160 MG PO TABS: 1.0000 | ORAL_TABLET | Freq: Two times a day (BID) | ORAL | 0 refills | Status: AC

## 2021-12-07 NOTE — Progress Notes (Signed)
?Cynthia Blankenship,Cynthia Blankenship,acting as a Education administrator for [employer], PA-C.,have documented all relevant documentation on the behalf of Cynthia Kirschner, PA-C,as directed by  Cynthia Kirschner, PA-C while in the presence of Cynthia Kirschner, PA-C. ? ?Acute Office Visit ? ?Subjective:  ? ? Patient ID: Cynthia Blankenship, female    DOB: Oct 22, 1960, 61 y.o.   MRN: [id number] ? ?Cc. Urinary frequency, urgency ? ?HPI ?Cynthia Blankenship is a 61 y/o female who presents today with urinary pressure, urgency, frequency x 3 days. Today saw some red in her urine. Denies dysuria, fevers, back pain.  ? ?Past Medical History:  ?Diagnosis Date  ? Hypertension   ? Thyroid condition   ? ? ?Past Surgical History:  ?Procedure Laterality Date  ? ABDOMINAL HYSTERECTOMY    ? COLONOSCOPY N/A 09/09/2021  ? Procedure: COLONOSCOPY;  Surgeon: Cynthia Manifold, MD;  Location: [facility] ENDOSCOPY;  Service: Endoscopy;  Laterality: N/A;  ? TUBAL LIGATION    ? ? ?Family History  ?Problem Relation Age of Onset  ? Hypertension Mother   ? Alcohol abuse Father   ? Cirrhosis Father   ? Hypertension Sister   ? Hypertension Sister   ? Hypertension Sister   ? Kidney failure Sister   ? Hypertension Brother   ? Hypertension Brother   ? Breast cancer Neg Hx   ? ? ?Social History  ? ?Socioeconomic History  ? Marital status: Married  ?  Spouse name: Not on file  ? Number of children: Not on file  ? Years of education: Not on file  ? Highest education level: Not on file  ?Occupational History  ? Not on file  ?Tobacco Use  ? Smoking status: Former  ? Smokeless tobacco: Never  ?Vaping Use  ? Vaping Use: Never used  ?Substance and Sexual Activity  ? Alcohol use: No  ?  Alcohol/week: 0.0 standard drinks  ? Drug use: No  ? Sexual activity: Yes  ?Other Topics Concern  ? Not on file  ?Social History Narrative  ? Not on file  ? ?Social Determinants of Health  ? ?Financial Resource Strain: Not on file  ?Food Insecurity: Not on file  ?Transportation Needs: Not on file  ?Physical Activity: Not on file   ?Stress: Not on file  ?Social Connections: Not on file  ?Intimate Partner Violence: Not on file  ? ? ?Outpatient Medications Prior to Visit  ?Medication Sig Dispense Refill  ? atenolol (TENORMIN) 50 MG tablet Take 1 tablet (50 mg total) by mouth daily. 90 tablet 3  ? levothyroxine (EUTHYROX) 75 MCG tablet Take 1 tablet (75 mcg total) by mouth daily before breakfast. 90 tablet 3  ? Multiple Vitamin (MULTIVITAMIN) tablet Take 1 tablet by mouth daily.    ? ?No facility-administered medications prior to visit.  ? ? ?No Known Allergies ? ?Review of Systems  ?Constitutional:  Negative for fatigue and fever.  ?Respiratory:  Negative for cough and shortness of breath.   ?Cardiovascular:  Negative for chest pain and leg swelling.  ?Gastrointestinal:  Negative for abdominal pain.  ?Genitourinary:  Positive for frequency, hematuria and urgency.  ?Neurological:  Negative for dizziness and headaches.  ? ?   ?Objective:  ?  ?Physical Exam ?Constitutional:   ?   General: She is awake.  ?   Appearance: She is well-developed.  ?HENT:  ?   Head: Normocephalic.  ?Eyes:  ?   Conjunctiva/sclera: Conjunctivae normal.  ?Cardiovascular:  ?   Rate and Rhythm: Normal rate and regular rhythm.  ?   Heart  sounds: Normal heart sounds.  ?Pulmonary:  ?   Effort: Pulmonary effort is normal.  ?   Breath sounds: Normal breath sounds.  ?Abdominal:  ?   Palpations: Abdomen is soft. There is no mass.  ?   Tenderness: There is no abdominal tenderness. There is no right CVA tenderness, left CVA tenderness, guarding or rebound.  ?   Hernia: No hernia is present.  ?Skin: ?   General: Skin is warm.  ?Neurological:  ?   Mental Status: She is alert and oriented to person, place, and time.  ?Psychiatric:     ?   Attention and Perception: Attention normal.     ?   Mood and Affect: Mood normal.     ?   Speech: Speech normal.     ?   Behavior: Behavior is cooperative.  ? ? ?There were no vitals taken for this visit. ?Wt Readings from Last 3 Encounters:  ?10/19/21  130 lb (59 kg)  ?09/25/21 129 lb (58.5 kg)  ?09/09/21 128 lb 4.2 oz (58.2 kg)  ? ? ?Health Maintenance Due  ?Topic Date Due  ? HIV Screening  Never done  ? Zoster Vaccines- Shingrix (1 of 2) Never done  ? PAP SMEAR-Modifier  03/17/2018  ? COVID-19 Vaccine (5 - Booster for Pfizer series) 07/22/2021  ? ? ?There are no preventive care reminders to display for this patient. ? ? ?Lab Results  ?Component Value Date  ? TSH 0.71 10/19/2021  ? ?Lab Results  ?Component Value Date  ? WBC 3.8 07/28/2021  ? HGB 13.2 07/28/2021  ? HCT 39.3 07/28/2021  ? MCV 85 07/28/2021  ? PLT 255 07/28/2021  ? ?Lab Results  ?Component Value Date  ? NA 139 07/28/2021  ? K 4.3 07/28/2021  ? CO2 26 07/28/2021  ? GLUCOSE 94 07/28/2021  ? BUN 11 07/28/2021  ? CREATININE 0.72 07/28/2021  ? BILITOT 0.4 07/28/2021  ? ALKPHOS 83 07/28/2021  ? AST 17 07/28/2021  ? ALT 18 07/28/2021  ? PROT 7.3 07/28/2021  ? ALBUMIN 4.5 07/28/2021  ? CALCIUM 10.0 07/28/2021  ? EGFR 96 07/28/2021  ? ?Lab Results  ?Component Value Date  ? CHOL 168 07/28/2021  ? ?Lab Results  ?Component Value Date  ? HDL 58 07/28/2021  ? ?Lab Results  ?Component Value Date  ? LDLCALC 100 (H) 07/28/2021  ? ?Lab Results  ?Component Value Date  ? TRIG 47 07/28/2021  ? ?Lab Results  ?Component Value Date  ? CHOLHDL 2.9 07/28/2021  ? ?No results found for: HGBA1C ? ?   ?Assessment & Plan:  ? ?Acute cystitis ?Rx bactrim ds pobid x 5 days ?Advised fluids, cranberry juice ?Culture ordered ? ?Cynthia Blankenship, Cynthia Drubel, PA-C have reviewed all documentation for this visit. The documentation on  12/07/2021 for the exam, diagnosis, procedures, and orders are all accurate and complete. ? ?Cynthia Drubel, PA-C ?[facility] ?[address] ?[place], [place], [address] ?Office: [phone] ?Fax: [phone]  ? ?

## 2021-12-10 LAB — URINE CULTURE

## 2022-01-12 ENCOUNTER — Other Ambulatory Visit: Payer: Self-pay | Admitting: Family Medicine

## 2022-01-12 DIAGNOSIS — I10 Essential (primary) hypertension: Secondary | ICD-10-CM

## 2022-01-12 NOTE — Telephone Encounter (Signed)
Requested Prescriptions  ?Pending Prescriptions Disp Refills  ?? atenolol (TENORMIN) 50 MG tablet [Pharmacy Med Name: ATENOLOL 50 MG TABLET] 90 tablet 0  ?  Sig: TAKE 1 TABLET BY MOUTH EVERY DAY  ?  ? Cardiovascular: Beta Blockers 2 Failed - 01/12/2022  2:33 AM  ?  ?  Failed - Last BP in normal range  ?  BP Readings from Last 1 Encounters:  ?12/07/21 (!) 152/71  ?   ?  ?  Passed - Cr in normal range and within 360 days  ?  Creatinine, Ser  ?Date Value Ref Range Status  ?07/28/2021 0.72 0.57 - 1.00 mg/dL Final  ?   ?  ?  Passed - Last Heart Rate in normal range  ?  Pulse Readings from Last 1 Encounters:  ?12/07/21 91  ?   ?  ?  Passed - Valid encounter within last 6 months  ?  Recent Outpatient Visits   ?      ? 1 month ago Acute cystitis with hematuria  ? [facility] Thedore Mins, Ria Comment, PA-C  ? 11 months ago Essential hypertension  ? [facility] Jerrol Banana., MD  ? 1 year ago Thyroid disease  ? [facility] Jerrol Banana., MD  ? 1 year ago Adult hypothyroidism  ? [facility] Jerrol Banana., MD  ? 1 year ago Adult hypothyroidism  ? [facility] Jerrol Banana., MD  ?  ?  ?Future Appointments   ?        ? In 4 weeks [name], PA-C [facility]  ?  ? ?  ?  ?  ? ?

## 2022-01-27 ENCOUNTER — Ambulatory Visit (INDEPENDENT_AMBULATORY_CARE_PROVIDER_SITE_OTHER): Payer: 59 | Admitting: Physician Assistant

## 2022-01-27 ENCOUNTER — Encounter: Payer: Self-pay | Admitting: Physician Assistant

## 2022-01-27 VITALS — BP 137/73 | HR 63 | Ht 62.0 in | Wt 132.4 lb

## 2022-01-27 DIAGNOSIS — M25649 Stiffness of unspecified hand, not elsewhere classified: Secondary | ICD-10-CM | POA: Diagnosis not present

## 2022-01-27 MED ORDER — MELOXICAM 15 MG PO TABS: 15.0000 mg | ORAL_TABLET | Freq: Every day | ORAL | 1 refills | Status: DC

## 2022-01-27 NOTE — Assessment & Plan Note (Signed)
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  ?

## 2022-01-27 NOTE — Progress Notes (Signed)
?  ? ?I,Cynthia Blankenship,acting as a Education administrator for [employer], PA-C.,have documented all relevant documentation on the behalf of Cynthia Kirschner, PA-C,as directed by  Cynthia Kirschner, PA-C while in the presence of Cynthia Kirschner, PA-C. ? ? ?Established patient visit ? ? ?Patient: Cynthia Blankenship   DOB: 1961/09/10   61 y.o. Female  MRN: [id number] ?Visit Date: 01/27/2022 ? ?Today's healthcare provider: [name], PA-C  ? ?Cc. bil ? ?Subjective  ?  ?HPI  ?Cynthia Blankenship is a 61 y/o female who presents today with bilateral wrist and hand pain, stiffness, decreased ROM and strength for years. She dealt with it but they recently changed her position at work (works in [employer], [employer]) and she is using her hands more. Reports elbow and shoulder pain as well. ?Stiffness is the worst in the morning. Will soak her hands in hot water, uses ice at work.  ?Just started taking tylenol.  ? ?Reports her son, who is in his 45s, has a similar issue with his hands.  ? ?Medications: ?Outpatient Medications Prior to Visit  ?Medication Sig  ? atenolol (TENORMIN) 50 MG tablet TAKE 1 TABLET BY MOUTH EVERY DAY  ? levothyroxine (EUTHYROX) 75 MCG tablet Take 1 tablet (75 mcg total) by mouth daily before breakfast.  ? Multiple Vitamin (MULTIVITAMIN) tablet Take 1 tablet by mouth daily.  ? ?No facility-administered medications prior to visit.  ? ? ?Review of Systems  ?Constitutional:  Negative for fatigue and fever.  ?Respiratory:  Negative for cough and shortness of breath.   ?Cardiovascular:  Negative for chest pain and leg swelling.  ?Gastrointestinal:  Negative for abdominal pain.  ?Musculoskeletal:  Positive for arthralgias and joint swelling.  ?Neurological:  Negative for dizziness and headaches.  ? ?  Objective  ?  ?Blood pressure 137/73, pulse 63, height 5' 2"  (1.575 m), weight 132 lb 6.4 oz (60.1 kg), SpO2 99 %.  ? ?Physical Exam ?Vitals reviewed.  ?Constitutional:   ?   Appearance: She is not ill-appearing.  ?HENT:  ?   Head: Normocephalic.   ?Eyes:  ?   Conjunctiva/sclera: Conjunctivae normal.  ?Cardiovascular:  ?   Rate and Rhythm: Normal rate.  ?Pulmonary:  ?   Effort: Pulmonary effort is normal. No respiratory distress.  ?Musculoskeletal:  ?   Right wrist: Decreased range of motion.  ?   Left wrist: Decreased range of motion.  ?   Right hand: Decreased range of motion.  ?   Left hand: Decreased range of motion.  ?   Comments: Decreased grip strength bilaterally, R worse than L ?Stiffness present in fingers and wrists  ?Neurological:  ?   General: No focal deficit present.  ?   Mental Status: She is alert and oriented to person, place, and time.  ?Psychiatric:     ?   Mood and Affect: Mood normal.     ?   Behavior: Behavior normal.  ?  ? ?No results found for any visits on 01/27/22. ? Assessment & Plan  ?  ? ?Problem List Items Addressed This Visit   ? ?  ? Other  ? Stiffness of hand joint - Primary  ?  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  ? ?  ?  ? Relevant Medications  ? meloxicam (MOBIC) 15 MG tablet  ? Other Relevant Orders  ? Sed Rate (ESR)  ? C-reactive protein  ? Rheumatoid  Factor  ? ANA Direct w/Reflex if Positive  ?  ?I, Cynthia Kirschner, PA-C have reviewed all documentation for this visit. The documentation on  01/27/2022 for the exam, diagnosis, procedures, and orders are all accurate and complete. ? ?Cynthia Kirschner, PA-C ?[place] ?[address] ?[place], [place], [address] ?Office: [phone] ?Fax: [phone]  ? ?[facility] ? ?

## 2022-01-29 LAB — ANA W/REFLEX IF POSITIVE: Anti Nuclear Antibody (ANA): NEGATIVE

## 2022-01-29 LAB — RHEUMATOID FACTOR: Rheumatoid fact SerPl-aCnc: 12.4 IU/mL (ref <14.0)

## 2022-01-29 LAB — SEDIMENTATION RATE: Sed Rate: 10 mm/hr (ref 0–40)

## 2022-01-29 LAB — C-REACTIVE PROTEIN: CRP: 16 mg/L — ABNORMAL HIGH (ref 0–10)

## 2022-02-09 ENCOUNTER — Encounter: Payer: Self-pay | Admitting: Physician Assistant

## 2022-02-09 ENCOUNTER — Ambulatory Visit (INDEPENDENT_AMBULATORY_CARE_PROVIDER_SITE_OTHER): Payer: 59 | Admitting: Physician Assistant

## 2022-02-09 VITALS — BP 132/80 | HR 77 | Ht 62.0 in | Wt 131.5 lb

## 2022-02-09 DIAGNOSIS — Z0000 Encounter for general adult medical examination without abnormal findings: Secondary | ICD-10-CM | POA: Diagnosis not present

## 2022-02-09 DIAGNOSIS — M19049 Primary osteoarthritis, unspecified hand: Secondary | ICD-10-CM

## 2022-02-09 DIAGNOSIS — I10 Essential (primary) hypertension: Secondary | ICD-10-CM

## 2022-02-09 DIAGNOSIS — E039 Hypothyroidism, unspecified: Secondary | ICD-10-CM

## 2022-02-09 MED ORDER — ATENOLOL 50 MG PO TABS: 50.0000 mg | ORAL_TABLET | Freq: Every day | ORAL | 1 refills | Status: DC

## 2022-02-09 NOTE — Assessment & Plan Note (Addendum)
B/l No rheum markers were positive aside from elevation in CRP, likely secondary to repetitive hand movements; pt declining referral to rheum for now Uses arthritis gloves, wrist braces. Last visit started mobic 15 mg daily, does have some improvement Advised cmp in 4 weeks to eval kidney function d/t mobic use

## 2022-02-09 NOTE — Assessment & Plan Note (Signed)
Reviewed last tsh; 2/23  Chronic and well controlled Continue medication

## 2022-02-09 NOTE — Assessment & Plan Note (Addendum)
Chronic, elevated in office but well controlled at home manages with atenolol 50 mg, continue medication Reviewed last CMP

## 2022-02-09 NOTE — Progress Notes (Signed)
I,Cynthia Blankenship,acting as a Education administrator for [employer], PA-C.,have documented all relevant documentation on the behalf of Cynthia Kirschner, PA-C,as directed by  Cynthia Kirschner, PA-C while in the presence of Cynthia Kirschner, PA-C.   Complete physical exam   Patient: Cynthia Blankenship   DOB: November 11, 1960   61 y.o. Female  MRN: [id number] Visit Date: 02/09/2022  Today's healthcare provider: [name], PA-C   Cc. cpe  Subjective    Cynthia Blankenship is a 61 y.o. female who presents today for a complete physical exam.  She reports consuming a general and watch salt intake  diet. The patient does not participate in regular exercise at present. She generally feels well. She reports sleeping well. She does not have additional problems to discuss today.   Reports improvement of hand pain w/ Mobic 15 mg daily.    Past Medical History:  Diagnosis Date   Hypertension    Thyroid condition    Past Surgical History:  Procedure Laterality Date   ABDOMINAL HYSTERECTOMY     COLONOSCOPY N/A 09/09/2021   Procedure: COLONOSCOPY;  Surgeon: Virgel Manifold, MD;  Location: [facility] ENDOSCOPY;  Service: Endoscopy;  Laterality: N/A;   TUBAL LIGATION     Social History   Socioeconomic History   Marital status: Married    Spouse name: Not on file   Number of children: Not on file   Years of education: Not on file   Highest education level: Not on file  Occupational History   Not on file  Tobacco Use   Smoking status: Former   Smokeless tobacco: Never  Vaping Use   Vaping Use: Never used  Substance and Sexual Activity   Alcohol use: No    Alcohol/week: 0.0 standard drinks   Drug use: No   Sexual activity: Yes  Other Topics Concern   Not on file  Social History Narrative   Not on file   Social Determinants of Health   Financial Resource Strain: Not on file  Food Insecurity: Not on file  Transportation Needs: Not on file  Physical Activity: Not on file  Stress: Not on file   Social Connections: Not on file  Intimate Partner Violence: Not on file   Family Status  Relation Name Status   Mother  Deceased   Father  Deceased   Sister  Alive   Sister  Alive   Sister  Deceased   Brother  Alive   Brother  Alive   Neg Hx  (Not Specified)   Family History  Problem Relation Age of Onset   Hypertension Mother    Alcohol abuse Father    Cirrhosis Father    Hypertension Sister    Hypertension Sister    Hypertension Sister    Kidney failure Sister    Hypertension Brother    Hypertension Brother    Breast cancer Neg Hx    No Known Allergies  Patient Care Team: Cynthia Kirschner, PA-C as PCP - General (Physician Assistant) Rubie Maid, MD as Referring Physician (Obstetrics and Gynecology)   Medications: Outpatient Medications Prior to Visit  Medication Sig   acetaminophen (TYLENOL) 500 MG tablet Take 500 mg by mouth every 6 (six) hours as needed.   levothyroxine (EUTHYROX) 75 MCG tablet Take 1 tablet (75 mcg total) by mouth daily before breakfast.   meloxicam (MOBIC) 15 MG tablet Take 1 tablet (15 mg total) by mouth daily.   Multiple Vitamin (MULTIVITAMIN) tablet Take 1 tablet by mouth daily.   [DISCONTINUED] atenolol (  TENORMIN) 50 MG tablet TAKE 1 TABLET BY MOUTH EVERY DAY   No facility-administered medications prior to visit.    Review of Systems  Musculoskeletal:  Positive for arthralgias and joint swelling.     Objective     Blood pressure 132/80, pulse 77, height '5\' 2"'$  (1.575 m), weight 131 lb 8 oz (59.6 kg), SpO2 100 %.    Physical Exam Constitutional:      General: She is awake.     Appearance: She is well-developed. She is not ill-appearing.  HENT:     Head: Normocephalic.     Right Ear: Tympanic membrane normal.     Left Ear: Tympanic membrane normal.     Nose: Nose normal. No congestion or rhinorrhea.     Mouth/Throat:     Pharynx: No oropharyngeal exudate or posterior oropharyngeal erythema.  Eyes:     Conjunctiva/sclera:  Conjunctivae normal.     Pupils: Pupils are equal, round, and reactive to light.  Neck:     Thyroid: No thyroid mass or thyromegaly.  Cardiovascular:     Rate and Rhythm: Normal rate and regular rhythm.     Heart sounds: Normal heart sounds.  Pulmonary:     Effort: Pulmonary effort is normal.     Breath sounds: Normal breath sounds.  Abdominal:     Palpations: Abdomen is soft.     Tenderness: There is no abdominal tenderness.  Musculoskeletal:     Right lower leg: No swelling. No edema.     Left lower leg: No swelling. No edema.  Lymphadenopathy:     Cervical: No cervical adenopathy.  Skin:    General: Skin is warm.  Neurological:     Mental Status: She is alert and oriented to person, place, and time.  Psychiatric:        Attention and Perception: Attention normal.        Mood and Affect: Mood normal.        Speech: Speech normal.        Behavior: Behavior normal. Behavior is cooperative.     Last depression screening scores    02/09/2022    9:31 AM 01/29/2021    8:23 AM 04/22/2020    9:43 AM  PHQ 2/9 Scores  PHQ - 2 Score 0 0 0  PHQ- 9 Score 0 1 0   Last fall risk screening    02/09/2022    9:30 AM  [place] in the past year? 0  Number falls in past yr: 0  Injury with Fall? 0  Risk for fall due to : No Fall Risks   Last Audit-C alcohol use screening    02/09/2022    9:30 AM  Alcohol Use Disorder Test (AUDIT)  1. How often do you have a drink containing alcohol? 0  2. How many drinks containing alcohol do you have on a typical day when you are drinking? 0  3. How often do you have six or more drinks on one occasion? 0  AUDIT-C Score 0   A score of 3 or more in women, and 4 or more in men indicates increased risk for alcohol abuse, EXCEPT if all of the points are from question 1   No results found for any visits on 02/09/22.  Assessment & Plan    Routine Health Maintenance and Physical Exam  Exercise Activities and Dietary  recommendations --balanced diet high in fiber and protein, low in sugars, carbs, fats. --physical activity/exercise 30 minutes 3-5 times  a week    Immunization History  Administered Date(s) Administered   Influenza Inj Mdck Quad Pf 06/24/2017, 06/10/2018   Influenza-Unspecified 07/14/2015, 06/30/2021   PFIZER(Purple Top)SARS-COV-2 Vaccination 03/14/2020, 04/04/2020, 10/17/2020, 05/27/2021   Td 03/28/2019   Tdap 11/27/2007    Health Maintenance  Topic Date Due   HIV Screening  Never done   Zoster Vaccines- Shingrix (1 of 2) Never done   COVID-19 Vaccine (5 - Booster for Pfizer series) 07/22/2021   INFLUENZA VACCINE  04/13/2022   MAMMOGRAM  08/05/2023   TETANUS/TDAP  03/27/2029   COLONOSCOPY (Pts 45-9yr Insurance coverage will need to be confirmed)  09/10/2031   Hepatitis C Screening  Completed   HPV VACCINES  Aged Out   PAP SMEAR-Modifier  Discontinued    Discussed health benefits of physical activity, and encouraged her to engage in regular exercise appropriate for her age and condition.  Problem List Items Addressed This Visit       Cardiovascular and Mediastinum   Essential hypertension    Chronic, elevated in office but well controlled at home manages with atenolol 50 mg, continue medication Reviewed last CMP        Relevant Medications   atenolol (TENORMIN) 50 MG tablet   Other Relevant Orders   Comprehensive Metabolic Panel (CMET)     Endocrine   Adult hypothyroidism    Reviewed last tsh; 2/23  Chronic and well controlled Continue medication       Relevant Medications   atenolol (TENORMIN) 50 MG tablet     Musculoskeletal and Integument   Arthritis of hand    B/l No rheum markers were positive aside from elevation in CRP, likely secondary to repetitive hand movements; pt declining referral to rheum for now Uses arthritis gloves, wrist braces. Last visit started mobic 15 mg daily, does have some improvement Advised cmp in 4 weeks to eval kidney  function d/t mobic use       Other Visit Diagnoses     Encounter for health maintenance examination    -  Primary        Return in about 6 months (around 08/12/2022) for hypertension.     I, LMikey Kirschner PA-C have reviewed all documentation for this visit. The documentation on  02/09/2022 for the exam, diagnosis, procedures, and orders are all accurate and complete.  LMikey Kirschner PA-C [facility] [place] [id number]: [phone]: [phone]

## 2022-03-09 LAB — COMPREHENSIVE METABOLIC PANEL
ALT: 8 IU/L (ref 0–32)
AST: 10 IU/L (ref 0–40)
Albumin/Globulin Ratio: 1.2 (ref 1.2–2.2)
Albumin: 3.8 g/dL (ref 3.8–4.8)
Alkaline Phosphatase: 75 IU/L (ref 44–121)
BUN/Creatinine Ratio: 18 (ref 12–28)
BUN: 10 mg/dL (ref 8–27)
Bilirubin Total: 0.2 mg/dL (ref 0.0–1.2)
CO2: 22 mmol/L (ref 20–29)
Calcium: 9.7 mg/dL (ref 8.7–10.3)
Chloride: 101 mmol/L (ref 96–106)
Creatinine, Ser: 0.56 mg/dL — ABNORMAL LOW (ref 0.57–1.00)
Globulin, Total: 3.3 g/dL (ref 1.5–4.5)
Glucose: 101 mg/dL — ABNORMAL HIGH (ref 70–99)
Potassium: 4.4 mmol/L (ref 3.5–5.2)
Sodium: 136 mmol/L (ref 134–144)
Total Protein: 7.1 g/dL (ref 6.0–8.5)
eGFR: 104 mL/min/{1.73_m2} (ref >59)

## 2022-03-11 ENCOUNTER — Ambulatory Visit: Payer: Self-pay

## 2022-03-11 ENCOUNTER — Ambulatory Visit: Payer: Self-pay | Admitting: *Deleted

## 2022-03-11 NOTE — Telephone Encounter (Signed)
Summary: cold to the bone /advice   Pt stated she is starting to get cold spells and feels cold a lot / pt wanted to let Ria Comment know / she is concerned about why this is/ please advise     Called pt - LMOM

## 2022-03-11 NOTE — Telephone Encounter (Signed)
  Chief Complaint: cold "all of the time" request appt  Symptoms: cold spells in arms and legs. Feels like the cold will cut into shoulders and feels like circulation may be affected. Arthritis reported. C/o right knee feels like cant bend at times. Requesting blood to be checked  Frequency: greater than a week  Pertinent Negatives: Patient denies chest pain no difficulty breathing, no fever, no dizziness no weakness noted  Disposition: '[]'$ ED /'[]'$ Urgent Care (no appt availability in office) / '[x]'$ Appointment(In office/virtual)/ '[]'$  [facility]/ '[]'$ Home Care/ '[]'$ Refused Recommended Disposition /'[]'$  Mobile Bus/ '[]'$  Follow-up with PCP Additional Notes:   Recommended to wear layers , socks on feet . Drink plenty of water. Call back or go to ED if sx worsen.    Reason for Disposition  Nursing judgment or information in reference  Answer Assessment - Initial Assessment Questions 1. REASON FOR CALL: "What is your main concern right now?"     Feels cold all of the time  2. ONSET: "When did the sx start?"     Greater than a week  3. SEVERITY: "How bad is the sx ?"     Can still work and manage normal activities  4. FEVER: "Do you have a fever?"     no 5. OTHER SYMPTOMS: "Do you have any other new symptoms?"     Right knee soreness  6. TREATMENTS AND RESPONSE: "What have you done so far to try to make this better? What medicines have you used?"     Wears more clothes 7. PREGNANCY: "Is there any chance you are pregnant?" "When was your last menstrual period?"     na  Protocols used: No Guideline Available-A-AH

## 2022-03-11 NOTE — Telephone Encounter (Addendum)
Summary: cold to the bone /advice   Pt stated she is starting to get cold spells and feels cold a lot / pt wanted to let Ria Comment know / she is concerned about why this is/ please advise     Third attempt to reach pt without success. Routing pt sx summary to office.

## 2022-03-12 NOTE — Telephone Encounter (Signed)
Sounds good see her then

## 2022-03-15 ENCOUNTER — Encounter: Payer: Self-pay | Admitting: Physician Assistant

## 2022-03-15 ENCOUNTER — Ambulatory Visit: Payer: 59 | Admitting: Physician Assistant

## 2022-03-15 VITALS — BP 138/80 | HR 72 | Wt 124.6 lb

## 2022-03-15 DIAGNOSIS — R209 Unspecified disturbances of skin sensation: Secondary | ICD-10-CM

## 2022-03-15 NOTE — Progress Notes (Signed)
    I,[name],acting as a Education administrator for [employer], PA-C.,have documented all relevant documentation on the behalf of [name], PA-C,as directed by  [name], PA-C while in the presence of [name], PA-C.   Acute Office Visit  Subjective:     Patient ID: Cynthia Blankenship, female    DOB: [birth year], 61 y.o.   MRN: [id number]  Cc. Cold feeling extremities   HPI 1-2 weeks ago she reports feeling cold hands and feet. Concerned as she has been anemic in the past.  She reports feeling better today, she has been off of work for the last week and has had time to rest.   Also curious what her vitamin D level is, she takes 1,000 iu daily.   Denies dizziness, fatigue, heart palpitations.     Objective:    Blood pressure 138/80, pulse 72, weight 124 lb 9.6 oz (56.5 kg), SpO2 100 %.   Physical Exam Vitals reviewed.  Constitutional:      Appearance: She is not ill-appearing.  HENT:     Head: Normocephalic.  Eyes:     Conjunctiva/sclera: Conjunctivae normal.  Cardiovascular:     Rate and Rhythm: Normal rate.     Pulses:          Dorsalis pedis pulses are 3+ on the right side and 3+ on the left side.  Pulmonary:     Effort: Pulmonary effort is normal. No respiratory distress.  Feet:     Right foot:     Skin integrity: Skin integrity normal. No erythema.     Toenail Condition: Right toenails are normal.     Left foot:     Skin integrity: Skin integrity normal. No erythema.     Toenail Condition: Left toenails are normal.  Neurological:     General: No focal deficit present.     Mental Status: She is alert and oriented to person, place, and time.  Psychiatric:        Mood and Affect: Mood normal.        Behavior: Behavior normal.     No results found for any visits on 03/15/22.      Assessment & Plan:   Cold extremities Ordered cbc/iron panel r/o anemia Will check vit d per pt Likely secondary to poor circulation, pt stands in one place all  day for work.   I, [name], PA-C have reviewed all documentation for this visit. The documentation on  03/15/2022 for the exam, diagnosis, procedures, and orders are all accurate and complete.  [name], PA-C [facility] [address] [place], [place], [address] Office: [phone] Fax: [phone]

## 2022-03-16 LAB — CBC WITH DIFFERENTIAL/PLATELET
Basophils Absolute: 0 10*3/uL (ref 0.0–0.2)
Basos: 1 %
EOS (ABSOLUTE): 0.1 10*3/uL (ref 0.0–0.4)
Eos: 3 %
Hematocrit: 33.1 % — ABNORMAL LOW (ref 34.0–46.6)
Hemoglobin: 10.7 g/dL — ABNORMAL LOW (ref 11.1–15.9)
Immature Grans (Abs): 0 10*3/uL (ref 0.0–0.1)
Immature Granulocytes: 1 %
Lymphocytes Absolute: 1.5 10*3/uL (ref 0.7–3.1)
Lymphs: 31 %
MCH: 26.7 pg (ref 26.6–33.0)
MCHC: 32.3 g/dL (ref 31.5–35.7)
MCV: 83 fL (ref 79–97)
Monocytes Absolute: 0.6 10*3/uL (ref 0.1–0.9)
Monocytes: 11 %
Neutrophils Absolute: 2.6 10*3/uL (ref 1.4–7.0)
Neutrophils: 53 %
Platelets: 387 10*3/uL (ref 150–450)
RBC: 4.01 x10E6/uL (ref 3.77–5.28)
RDW: 12.5 % (ref 11.7–15.4)
WBC: 4.9 10*3/uL (ref 3.4–10.8)

## 2022-03-16 LAB — IRON,TIBC AND FERRITIN PANEL
Ferritin: 410 ng/mL — ABNORMAL HIGH (ref 15–150)
Iron Saturation: 7 % — CL (ref 15–55)
Iron: 15 ug/dL — ABNORMAL LOW (ref 27–139)
Total Iron Binding Capacity: 207 ug/dL — ABNORMAL LOW (ref 250–450)
UIBC: 192 ug/dL (ref 118–369)

## 2022-03-16 LAB — VITAMIN D 25 HYDROXY (VIT D DEFICIENCY, FRACTURES): Vit D, 25-Hydroxy: 31.5 ng/mL (ref 30.0–100.0)

## 2022-03-17 ENCOUNTER — Other Ambulatory Visit: Payer: Self-pay | Admitting: Physician Assistant

## 2022-03-17 DIAGNOSIS — D509 Iron deficiency anemia, unspecified: Secondary | ICD-10-CM

## 2022-03-17 MED ORDER — IRON (FERROUS SULFATE) 325 (65 FE) MG PO TABS: 325.0000 mg | ORAL_TABLET | Freq: Every day | ORAL | 1 refills | Status: DC

## 2022-04-30 ENCOUNTER — Telehealth: Payer: Self-pay

## 2022-04-30 NOTE — Telephone Encounter (Signed)
Copied from [place] [phone]. Topic: General - Other >> Apr 30, 2022 11:51 AM Sabas Sous wrote: Reason for CRM: Pt called requesting for a pause on her meloxicam medication. She has to break them in half bc 15 mg is too much. Please pause all orders until patient requests otherwise.

## 2022-05-04 NOTE — Telephone Encounter (Signed)
Noted ty

## 2022-06-21 ENCOUNTER — Other Ambulatory Visit: Payer: Self-pay | Admitting: Obstetrics and Gynecology

## 2022-06-21 ENCOUNTER — Other Ambulatory Visit: Payer: Self-pay | Admitting: Physician Assistant

## 2022-06-21 DIAGNOSIS — Z1231 Encounter for screening mammogram for malignant neoplasm of breast: Secondary | ICD-10-CM

## 2022-07-25 ENCOUNTER — Other Ambulatory Visit: Payer: Self-pay | Admitting: Physician Assistant

## 2022-07-25 DIAGNOSIS — M25649 Stiffness of unspecified hand, not elsewhere classified: Secondary | ICD-10-CM

## 2022-08-10 ENCOUNTER — Ambulatory Visit
Admission: RE | Admit: 2022-08-10 | Discharge: 2022-08-10 | Disposition: A | Discharge status: Home / Self Care | Payer: 59 | Source: Ambulatory Visit | Attending: Physician Assistant | Admitting: Physician Assistant

## 2022-08-10 DIAGNOSIS — Z1231 Encounter for screening mammogram for malignant neoplasm of breast: Secondary | ICD-10-CM | POA: Insufficient documentation

## 2022-08-30 ENCOUNTER — Ambulatory Visit: Payer: 59 | Admitting: Physician Assistant

## 2022-09-01 ENCOUNTER — Other Ambulatory Visit: Payer: Self-pay | Admitting: Internal Medicine

## 2022-09-01 DIAGNOSIS — E039 Hypothyroidism, unspecified: Secondary | ICD-10-CM

## 2022-09-04 ENCOUNTER — Other Ambulatory Visit: Payer: Self-pay | Admitting: Physician Assistant

## 2022-09-04 DIAGNOSIS — D509 Iron deficiency anemia, unspecified: Secondary | ICD-10-CM

## 2022-09-26 ENCOUNTER — Other Ambulatory Visit: Payer: Self-pay | Admitting: Physician Assistant

## 2022-09-26 DIAGNOSIS — I10 Essential (primary) hypertension: Secondary | ICD-10-CM

## 2022-10-11 ENCOUNTER — Ambulatory Visit: Payer: 59 | Admitting: Physician Assistant

## 2022-10-11 ENCOUNTER — Encounter: Payer: Self-pay | Admitting: Physician Assistant

## 2022-10-11 VITALS — BP 135/76 | HR 74 | Temp 98.4°F | Wt 129.6 lb

## 2022-10-11 DIAGNOSIS — I10 Essential (primary) hypertension: Secondary | ICD-10-CM

## 2022-10-11 DIAGNOSIS — M19049 Primary osteoarthritis, unspecified hand: Secondary | ICD-10-CM | POA: Diagnosis not present

## 2022-10-11 DIAGNOSIS — D509 Iron deficiency anemia, unspecified: Secondary | ICD-10-CM

## 2022-10-11 DIAGNOSIS — E039 Hypothyroidism, unspecified: Secondary | ICD-10-CM | POA: Diagnosis not present

## 2022-10-11 NOTE — Assessment & Plan Note (Signed)
Pt takes mobic 7.5 mg PRN Was being eval by specialist  Discussed hand OT briefly, pt would prefer to rest

## 2022-10-11 NOTE — Assessment & Plan Note (Addendum)
Will repeat cbc / iron panel Pt managing with iron PO

## 2022-10-11 NOTE — Assessment & Plan Note (Signed)
Managed with levothyroxine 75 mg Repeat tsh/t4 F/u annually if stable

## 2022-10-11 NOTE — Progress Notes (Signed)
I,[name],acting as a Education administrator for [employer], PA-C.,have documented all relevant documentation on the behalf of Cynthia Kirschner, PA-C,as directed by  Cynthia Kirschner, PA-C while in the presence of Cynthia Kirschner, PA-C.  Established patient visit   Patient: Cynthia Blankenship   DOB: [birth year]   62 y.o. Female  MRN: [id number] Visit Date: 10/11/2022  Today's healthcare provider: [name], PA-C   Cc. Htn f/u, hypothyroid f/u Subjective    HPI Pt reports no improvement in arthritis in hands. She is out of work, hopefully for good-- planning to retire within the year. This has improved her pain somewhat. She reports having no grip ability or fine motor ability in bilateral hands.   Hypertension, follow-up  BP Readings from Last 3 Encounters:  10/11/22 135/76  03/15/22 138/80  02/09/22 132/80   Wt Readings from Last 3 Encounters:  10/11/22 129 lb 9.6 oz (58.8 kg)  03/15/22 124 lb 9.6 oz (56.5 kg)  02/09/22 131 lb 8 oz (59.6 kg)     She was last seen for hypertension 8 months ago.  BP at that visit was 132/80. Management since that visit includes continue current treatment.  She reports excellent compliance with treatment. She is not having side effects.  She is following a Regular diet. She is not exercising. She does not smoke.  Use of agents associated with hypertension: none.   Outside blood pressures are  Symptoms: No chest pain No chest pressure  No palpitations No syncope  No dyspnea No orthopnea  No paroxysmal nocturnal dyspnea No lower extremity edema   Pertinent labs Lab Results  Component Value Date   CHOL 168 07/28/2021   HDL 58 07/28/2021   LDLCALC 100 (H) 07/28/2021   TRIG 47 07/28/2021   CHOLHDL 2.9 07/28/2021   Lab Results  Component Value Date   NA 136 03/08/2022   K 4.4 03/08/2022   CREATININE 0.56 (L) 03/08/2022   EGFR 104 03/08/2022   GLUCOSE 101 (H) 03/08/2022   TSH 0.71 10/19/2021     The 10-year ASCVD risk score  (Arnett DK, et al., 2019) is: 7%  ---------------------------------------------------------------------------------------------------  Hypothyroid, follow-up  Lab Results  Component Value Date   TSH 0.71 10/19/2021   TSH 1.47 10/17/2020   TSH 1.04 08/04/2020   FREET4 1.47 06/09/2020    Wt Readings from Last 3 Encounters:  10/11/22 129 lb 9.6 oz (58.8 kg)  03/15/22 124 lb 9.6 oz (56.5 kg)  02/09/22 131 lb 8 oz (59.6 kg)   She reports excellent compliance with treatment. She is not having side effects.   Symptoms: No change in energy level No constipation  No diarrhea No heat / cold intolerance  No nervousness No palpitations  No weight changes    -----------------------------------------------------------------------------------------  Medications: Outpatient Medications Prior to Visit  Medication Sig Note   acetaminophen (TYLENOL) 500 MG tablet Take 500 mg by mouth every 6 (six) hours as needed. 10/11/2022: prn   atenolol (TENORMIN) 50 MG tablet TAKE 1 TABLET BY MOUTH EVERY DAY    cholecalciferol (VITAMIN D3) 25 MCG (1000 UNIT) tablet Take 1,000 Units by mouth daily.    ferrous sulfate (CVS IRON) 325 (65 FE) MG tablet TAKE 325 MG BY MOUTH DAILY.    levothyroxine (SYNTHROID) 75 MCG tablet TAKE 1 TABLET BY MOUTH DAILY BEFORE BREAKFAST.    meloxicam (MOBIC) 15 MG tablet Take 0.5 tablets (7.5 mg total) by mouth daily as needed. 10/11/2022: PRN   Multiple Vitamin (MULTIVITAMIN) tablet Take 1  tablet by mouth daily.    Diclofenac Sodium 3 % GEL SMARTSIG:1 sparingly Topical Twice Daily PRN (Patient not taking: Reported on 10/11/2022)    No facility-administered medications prior to visit.    Review of Systems  Constitutional:  Negative for fatigue and fever.  Respiratory:  Negative for cough and shortness of breath.   Cardiovascular:  Negative for chest pain and leg swelling.  Gastrointestinal:  Negative for abdominal pain.  Musculoskeletal:  Positive for arthralgias and joint  swelling.  Neurological:  Negative for dizziness and headaches.      Objective    Blood pressure 135/76, pulse 74, temperature 98.4 F (36.9 C), weight 129 lb 9.6 oz (58.8 kg), SpO2 100 %.   Physical Exam Constitutional:      General: She is awake.     Appearance: She is well-developed.  HENT:     Head: Normocephalic.  Eyes:     Conjunctiva/sclera: Conjunctivae normal.  Cardiovascular:     Rate and Rhythm: Normal rate and regular rhythm.     Heart sounds: Normal heart sounds.  Pulmonary:     Effort: Pulmonary effort is normal.     Breath sounds: Normal breath sounds.  Skin:    General: Skin is warm.  Neurological:     Mental Status: She is alert and oriented to person, place, and time.  Psychiatric:        Attention and Perception: Attention normal.        Mood and Affect: Mood normal.        Speech: Speech normal.        Behavior: Behavior is cooperative.     No results found for any visits on 10/11/22.  Assessment & Plan     Problem List Items Addressed This Visit       Cardiovascular and Mediastinum   Essential hypertension - Primary    Chronic, well controlled Managed with atenolol 50 mg  Reviewed cmp F/u 6 mo         Endocrine   Adult hypothyroidism    Managed with levothyroxine 75 mg Repeat tsh/t4 F/u annually if stable      Relevant Orders   TSH + free T4     Musculoskeletal and Integument   Arthritis of hand    Pt takes mobic 7.5 mg PRN Was being eval by specialist  Discussed hand OT briefly, pt would prefer to rest        Other   Iron deficiency anemia    Will repeat cbc / iron panel Pt managing with iron PO      Relevant Orders   Iron, TIBC and Ferritin Panel   CBC w/Diff/Platelet    Return in about 6 months (around 04/11/2023) for CPE.      I, Cynthia Kirschner, PA-C have reviewed all documentation for this visit. The documentation on  10/11/22  for the exam, diagnosis, procedures, and orders are all accurate and  complete.  Cynthia Kirschner, PA-C [facility] [address] [place], [place], [address] Office: [phone] Fax: [place]

## 2022-10-11 NOTE — Assessment & Plan Note (Signed)
Chronic, well controlled Managed with atenolol 50 mg  Reviewed cmp F/u 6 mo

## 2022-10-12 LAB — CBC WITH DIFFERENTIAL/PLATELET
Basophils Absolute: 0 10*3/uL (ref 0.0–0.2)
Basos: 1 %
EOS (ABSOLUTE): 0.1 10*3/uL (ref 0.0–0.4)
Eos: 1 %
Hematocrit: 37.6 % (ref 34.0–46.6)
Hemoglobin: 12.6 g/dL (ref 11.1–15.9)
Immature Grans (Abs): 0 10*3/uL (ref 0.0–0.1)
Immature Granulocytes: 1 %
Lymphocytes Absolute: 1.8 10*3/uL (ref 0.7–3.1)
Lymphs: 45 %
MCH: 28.6 pg (ref 26.6–33.0)
MCHC: 33.5 g/dL (ref 31.5–35.7)
MCV: 85 fL (ref 79–97)
Monocytes Absolute: 0.5 10*3/uL (ref 0.1–0.9)
Monocytes: 12 %
Neutrophils Absolute: 1.6 10*3/uL (ref 1.4–7.0)
Neutrophils: 40 %
Platelets: 320 10*3/uL (ref 150–450)
RBC: 4.41 x10E6/uL (ref 3.77–5.28)
RDW: 13.2 % (ref 11.7–15.4)
WBC: 3.9 10*3/uL (ref 3.4–10.8)

## 2022-10-12 LAB — IRON,TIBC AND FERRITIN PANEL
Ferritin: 544 ng/mL — ABNORMAL HIGH (ref 15–150)
Iron Saturation: 18 % (ref 15–55)
Iron: 48 ug/dL (ref 27–139)
Total Iron Binding Capacity: 271 ug/dL (ref 250–450)
UIBC: 223 ug/dL (ref 118–369)

## 2022-10-12 LAB — TSH+FREE T4
Free T4: 2 ng/dL — ABNORMAL HIGH (ref 0.82–1.77)
TSH: 1.73 u[IU]/mL (ref 0.450–4.500)

## 2022-10-25 ENCOUNTER — Ambulatory Visit: Payer: 59 | Admitting: Internal Medicine

## 2022-11-26 ENCOUNTER — Other Ambulatory Visit: Payer: Self-pay | Admitting: Internal Medicine

## 2022-11-26 DIAGNOSIS — E039 Hypothyroidism, unspecified: Secondary | ICD-10-CM

## 2022-12-03 ENCOUNTER — Other Ambulatory Visit: Payer: Self-pay | Admitting: Family Medicine

## 2022-12-03 DIAGNOSIS — D509 Iron deficiency anemia, unspecified: Secondary | ICD-10-CM

## 2022-12-03 NOTE — Telephone Encounter (Signed)
Requested Prescriptions  Pending Prescriptions Disp Refills   CVS IRON 325 (65 Fe) MG tablet [Pharmacy Med Name: CVS IRON 65 MG TABLET] 90 tablet 1    Sig: TAKE 325 MG BY MOUTH DAILY.     Endocrinology:  Minerals - Iron Supplementation Failed - 12/03/2022  2:17 AM      Failed - Ferritin in normal range and within 360 days    Ferritin  Date Value Ref Range Status  10/11/2022 544 (H) 15 - 150 ng/mL Final         Passed - HGB in normal range and within 360 days    Hemoglobin  Date Value Ref Range Status  10/11/2022 12.6 11.1 - 15.9 g/dL Final         Passed - HCT in normal range and within 360 days    Hematocrit  Date Value Ref Range Status  10/11/2022 37.6 34.0 - 46.6 % Final         Passed - RBC in normal range and within 360 days    RBC  Date Value Ref Range Status  10/11/2022 4.41 3.77 - 5.28 x10E6/uL Final  08/16/2017 3.02 (L) 3.80 - 5.10 Million/uL Final         Passed - Fe (serum) in normal range and within 360 days    Iron  Date Value Ref Range Status  10/11/2022 48 27 - 139 ug/dL Final   Iron Saturation  Date Value Ref Range Status  10/11/2022 18 15 - 55 % Final         Passed - Valid encounter within last 12 months    Recent Outpatient Visits           1 month ago Essential hypertension   [place] [name], PA-C   8 months ago Cold extremities   [place] [name], PA-C   9 months ago Encounter for health maintenance examination   [facility] Thedore Mins, Ria Comment, PA-C   10 months ago Stiffness of hand joint, unspecified laterality   [place] [name], PA-C   12 months ago Acute cystitis with hematuria   [facility] [name], PA-C       Future Appointments             In 4 months Thedore Mins, [place], [facility]

## 2022-12-20 ENCOUNTER — Other Ambulatory Visit: Payer: Self-pay | Admitting: Internal Medicine

## 2022-12-20 DIAGNOSIS — E039 Hypothyroidism, unspecified: Secondary | ICD-10-CM

## 2022-12-28 ENCOUNTER — Other Ambulatory Visit: Payer: Self-pay | Admitting: Physician Assistant

## 2022-12-28 DIAGNOSIS — D509 Iron deficiency anemia, unspecified: Secondary | ICD-10-CM

## 2023-01-24 DIAGNOSIS — G5603 Carpal tunnel syndrome, bilateral upper limbs: Secondary | ICD-10-CM | POA: Insufficient documentation

## 2023-02-22 ENCOUNTER — Other Ambulatory Visit: Payer: Self-pay | Admitting: Physician Assistant

## 2023-02-22 DIAGNOSIS — M25649 Stiffness of unspecified hand, not elsewhere classified: Secondary | ICD-10-CM

## 2023-03-03 ENCOUNTER — Encounter: Payer: Self-pay | Admitting: Physician Assistant

## 2023-03-03 ENCOUNTER — Ambulatory Visit (INDEPENDENT_AMBULATORY_CARE_PROVIDER_SITE_OTHER): Payer: 59 | Admitting: Physician Assistant

## 2023-03-03 VITALS — BP 126/63 | HR 63 | Temp 98.4°F | Resp 13 | Ht 60.0 in | Wt 128.5 lb

## 2023-03-03 DIAGNOSIS — E782 Mixed hyperlipidemia: Secondary | ICD-10-CM | POA: Insufficient documentation

## 2023-03-03 DIAGNOSIS — E039 Hypothyroidism, unspecified: Secondary | ICD-10-CM

## 2023-03-03 DIAGNOSIS — Z1231 Encounter for screening mammogram for malignant neoplasm of breast: Secondary | ICD-10-CM

## 2023-03-03 DIAGNOSIS — M19041 Primary osteoarthritis, right hand: Secondary | ICD-10-CM

## 2023-03-03 DIAGNOSIS — I10 Essential (primary) hypertension: Secondary | ICD-10-CM | POA: Diagnosis not present

## 2023-03-03 DIAGNOSIS — Z0000 Encounter for general adult medical examination without abnormal findings: Secondary | ICD-10-CM

## 2023-03-03 DIAGNOSIS — D509 Iron deficiency anemia, unspecified: Secondary | ICD-10-CM

## 2023-03-03 DIAGNOSIS — M19042 Primary osteoarthritis, left hand: Secondary | ICD-10-CM

## 2023-03-03 DIAGNOSIS — R051 Acute cough: Secondary | ICD-10-CM

## 2023-03-03 MED ORDER — HYDROCOD POLI-CHLORPHE POLI ER 10-8 MG/5ML PO SUER
5.0000 mL | Freq: Every evening | ORAL | 0 refills | Status: DC | PRN
Start: 2023-03-03 — End: 2023-10-18

## 2023-03-03 NOTE — Patient Instructions (Addendum)
Ronnald Ramp, MD  [id number] NPI Number

## 2023-03-03 NOTE — Assessment & Plan Note (Signed)
Managed with levothyroxine 75 mg  Given elevated t4 last visit, repeat tsh/t4

## 2023-03-03 NOTE — Assessment & Plan Note (Signed)
Repeat cbc/iron for further improvement/need for iron

## 2023-03-03 NOTE — Assessment & Plan Note (Signed)
Chronic well controlled Managed with atenolol 50 mg  Ordered cmp F/u 6 mo

## 2023-03-03 NOTE — Progress Notes (Signed)
I,Cynthia  Blankenship,acting as a Neurosurgeon for [employer], PA-C.,have documented all relevant documentation on the behalf of Cynthia Ferguson, PA-C,as directed by  Cynthia Ferguson, PA-C while in the presence of Cynthia Ferguson, PA-C.  Complete physical exam   Patient: Cynthia Blankenship   DOB: 1961/07/06   62 y.o. Female  MRN: [id number] Visit Date: 03/03/2023  Today's healthcare provider: [name], PA-C   Chief Complaint  Patient presents with   Annual Exam   Subjective    Cynthia Blankenship is a 62 y.o. female who presents today for a complete physical exam.  She reports consuming a general diet. She reports to walk 30 minutes a day. She generally feels well. She reports sleeping well. She does have additional problems to discuss today.  HPI   Patient would like to ask if it is alright to take beet root powder. Also states is having issues with her right arm getting stiff at times, makes it difficult to use it to get dressed. Continued stiffness of her hands.   Pt reports a cough after being around sick family members. No fevers, chills.  Past Medical History:  Diagnosis Date   Hypertension    Thyroid condition    Past Surgical History:  Procedure Laterality Date   ABDOMINAL HYSTERECTOMY     COLONOSCOPY N/A 09/09/2021   Procedure: COLONOSCOPY;  Surgeon: Cynthia Spillers, MD;  Location: [facility] ENDOSCOPY;  Service: Endoscopy;  Laterality: N/A;   TUBAL LIGATION     Social History   Socioeconomic History   Marital status: Married    Spouse name: Not on file   Number of children: Not on file   Years of education: Not on file   Highest education level: Not on file  Occupational History   Not on file  Tobacco Use   Smoking status: Former   Smokeless tobacco: Never  Vaping Use   Vaping Use: Never used  Substance and Sexual Activity   Alcohol use: No    Alcohol/week: 0.0 standard drinks of alcohol   Drug use: No   Sexual activity: Yes  Other Topics Concern   Not on  file  Social History Narrative   Not on file   Social Determinants of Health   Financial Resource Strain: Not on file  Food Insecurity: Not on file  Transportation Needs: Not on file  Physical Activity: Not on file  Stress: Not on file  Social Connections: Not on file  Intimate Partner Violence: Not on file   Family Status  Relation Name Status   Mother  Deceased   Father  Deceased   Sister  Alive   Sister  Alive   Sister  Deceased   Brother  Alive   Brother  Alive   Neg Hx  (Not Specified)   Family History  Problem Relation Age of Onset   Hypertension Mother    Alcohol abuse Father    Cirrhosis Father    Hypertension Sister    Hypertension Sister    Hypertension Sister    Kidney failure Sister    Hypertension Brother    Hypertension Brother    Breast cancer Neg Hx    No Known Allergies  Patient Care Team: Simmons-Robinson, Tawanna Cooler, MD as PCP - General (Family Medicine) Hildred Laser, MD as Referring Physician (Obstetrics and Gynecology)   Medications: Outpatient Medications Prior to Visit  Medication Sig   atenolol (TENORMIN) 50 MG tablet TAKE 1 TABLET BY MOUTH EVERY DAY   cholecalciferol (VITAMIN D3)  25 MCG (1000 UNIT) tablet Take 1,000 Units by mouth daily.   [facility] IRON 325 (65 Fe) MG tablet TAKE 325 MG BY MOUTH DAILY.   levothyroxine (SYNTHROID) 75 MCG tablet TAKE 1 TABLET BY MOUTH EVERY DAY BEFORE BREAKFAST   meloxicam (MOBIC) 15 MG tablet TAKE 0.5 TABLETS (7.5 MG TOTAL) BY MOUTH DAILY AS NEEDED.   Multiple Vitamin (MULTIVITAMIN) tablet Take 1 tablet by mouth daily.   [DISCONTINUED] acetaminophen (TYLENOL) 500 MG tablet Take 500 mg by mouth every 6 (six) hours as needed. (Patient not taking: Reported on 03/03/2023)   [DISCONTINUED] Diclofenac Sodium 3 % GEL SMARTSIG:1 sparingly Topical Twice Daily PRN (Patient not taking: Reported on 10/11/2022)   No facility-administered medications prior to visit.    Review of Systems  Musculoskeletal:  Positive for  arthralgias.  All other systems reviewed and are negative.   Objective    BP 126/63 (BP Location: Right Arm, Patient Position: Sitting, Cuff Size: Normal)   Pulse 63   Temp 98.4 F (36.9 C) (Oral)   Resp 13   Ht 5' (1.524 m)   Wt 128 lb 8 oz (58.3 kg)   SpO2 100%   BMI 25.10 kg/m   Physical Exam Constitutional:      General: She is awake.     Appearance: She is well-developed. She is not ill-appearing.  HENT:     Head: Normocephalic.     Right Ear: Tympanic membrane normal.     Left Ear: Tympanic membrane normal.     Nose: Nose normal. No congestion or rhinorrhea.     Mouth/Throat:     Pharynx: No oropharyngeal exudate or posterior oropharyngeal erythema.  Eyes:     Conjunctiva/sclera: Conjunctivae normal.     Pupils: Pupils are equal, round, and reactive to light.  Neck:     Thyroid: No thyroid mass or thyromegaly.  Cardiovascular:     Rate and Rhythm: Normal rate and regular rhythm.     Heart sounds: Normal heart sounds.  Pulmonary:     Effort: Pulmonary effort is normal.     Breath sounds: Normal breath sounds.  Abdominal:     Palpations: Abdomen is soft.     Tenderness: There is no abdominal tenderness.  Musculoskeletal:     Right lower leg: No swelling. No edema.     Left lower leg: No swelling. No edema.  Lymphadenopathy:     Cervical: No cervical adenopathy.  Skin:    General: Skin is warm.  Neurological:     Mental Status: She is alert and oriented to person, place, and time.  Psychiatric:        Attention and Perception: Attention normal.        Mood and Affect: Mood normal.        Speech: Speech normal.        Behavior: Behavior normal. Behavior is cooperative.      Last depression screening scores    03/03/2023    1:49 PM 02/09/2022    9:31 AM 01/29/2021    8:23 AM  PHQ 2/9 Scores  PHQ - 2 Score 0 0 0  PHQ- 9 Score 0 0 1   Last fall risk screening    03/03/2023    1:49 PM  Fall Risk   Falls in the past year? 0  Number falls in past yr:  0  Injury with Fall? 0  Risk for fall due to : No Fall Risks  Follow up Falls evaluation completed   Last Audit-C alcohol  use screening    03/03/2023    1:50 PM  Alcohol Use Disorder Test (AUDIT)  1. How often do you have a drink containing alcohol? 0  2. How many drinks containing alcohol do you have on a typical day when you are drinking? 0  3. How often do you have six or more drinks on one occasion? 0  AUDIT-C Score 0   A score of 3 or more in women, and 4 or more in men indicates increased risk for alcohol abuse, EXCEPT if all of the points are from question 1   No results found for any visits on 03/03/23.  Assessment & Plan    Routine Health Maintenance and Physical Exam  Exercise Activities and Dietary recommendations --balanced diet high in fiber and protein, low in sugars, carbs, fats. --physical activity/exercise 30 minutes 3-5 times a week     Immunization History  Administered Date(s) Administered   Influenza Inj Mdck Quad Pf 06/24/2017, 06/10/2018, 05/24/2019   Influenza,inj,Quad PF,6+ Mos 05/31/2022   Influenza-Unspecified 07/14/2015, 06/30/2021   PFIZER(Purple Top)SARS-COV-2 Vaccination 03/14/2020, 04/04/2020, 10/17/2020, 05/27/2021   Pfizer Covid-19 Vaccine Bivalent Booster 37yrs & up 07/02/2022   Td 03/28/2019   Tdap 11/27/2007   Zoster Recombinat (Shingrix) 08/19/2021    Health Maintenance  Topic Date Due   HIV Screening  Never done   Zoster Vaccines- Shingrix (2 of 2) 10/14/2021   COVID-19 Vaccine (6 - 2023-24 season) 08/27/2022   INFLUENZA VACCINE  04/14/2023   MAMMOGRAM  08/11/2023   DTaP/Tdap/Td (3 - Td or Tdap) 03/27/2029   Colonoscopy  09/10/2031   Hepatitis C Screening  Completed   HPV VACCINES  Aged Out   PAP SMEAR-Modifier  Discontinued    Discussed health benefits of physical activity, and encouraged her to engage in regular exercise appropriate for her age and condition.  Problem List Items Addressed This Visit       Cardiovascular  and Mediastinum   Essential hypertension    Chronic well controlled Managed with atenolol 50 mg  Ordered cmp F/u 6 mo       Relevant Orders   Comprehensive Metabolic Panel (CMET)     Endocrine   Adult hypothyroidism    Managed with levothyroxine 75 mg  Given elevated t4 last visit, repeat tsh/t4      Relevant Orders   TSH + free T4     Musculoskeletal and Integument   Arthritis of both hands    W/ stiffness Referring to OT Prn mobic.  Would like to refer to PT for shoulder as well, pt will hold off, does her own exercises at home      Relevant Orders   Ambulatory referral to Occupational Therapy     Other   Iron deficiency anemia    Repeat cbc/iron for further improvement/need for iron      Relevant Orders   Iron, TIBC and Ferritin Panel   CBC w/Diff/Platelet   Moderate mixed hyperlipidemia not requiring statin therapy   Relevant Orders   Lipid Profile   Other Visit Diagnoses     Annual physical exam    -  Primary   Acute cough       Relevant Medications   chlorpheniramine-HYDROcodone (TUSSIONEX) 10-8 MG/5ML   Breast cancer screening by mammogram       Relevant Orders   MM 3D SCREENING MAMMOGRAM BILATERAL BREAST       6. Acute cough Pt prefers to have tussionex prn at bedtime for cough as needed - chlorpheniramine-HYDROcodone (  TUSSIONEX) 10-8 MG/5ML; Take 5 mLs by mouth at bedtime as needed for cough.  Dispense: 70 mL; Refill: 0   Return in about 6 months (around 09/02/2023) for hypertension. Appt was made with new provider, Dr Roxan Hockey.    I, Cynthia Ferguson, PA-C have reviewed all documentation for this visit. The documentation on  03/03/23   for the exam, diagnosis, procedures, and orders are all accurate and complete.   Cynthia Ferguson, PA-C [facility] [address] [place], [place], [address] Office: [phone] Fax: [phone]   [facility]

## 2023-03-03 NOTE — Assessment & Plan Note (Addendum)
W/ stiffness Referring to OT Prn mobic.  Would like to refer to PT for shoulder as well, pt will hold off, does her own exercises at home

## 2023-03-05 LAB — LIPID PANEL
Chol/HDL Ratio: 3.4 ratio (ref 0.0–4.4)
Cholesterol, Total: 175 mg/dL (ref 100–199)
HDL: 51 mg/dL (ref >39)
LDL Chol Calc (NIH): 111 mg/dL — ABNORMAL HIGH (ref 0–99)
Triglycerides: 68 mg/dL (ref 0–149)
VLDL Cholesterol Cal: 13 mg/dL (ref 5–40)

## 2023-03-05 LAB — CBC WITH DIFFERENTIAL/PLATELET
Basophils Absolute: 0.1 10*3/uL (ref 0.0–0.2)
Basos: 1 %
EOS (ABSOLUTE): 0.1 10*3/uL (ref 0.0–0.4)
Eos: 2 %
Hematocrit: 36 % (ref 34.0–46.6)
Hemoglobin: 11.5 g/dL (ref 11.1–15.9)
Immature Grans (Abs): 0.1 10*3/uL (ref 0.0–0.1)
Immature Granulocytes: 2 %
Lymphocytes Absolute: 2 10*3/uL (ref 0.7–3.1)
Lymphs: 33 %
MCH: 25.8 pg — ABNORMAL LOW (ref 26.6–33.0)
MCHC: 31.9 g/dL (ref 31.5–35.7)
MCV: 81 fL (ref 79–97)
Monocytes Absolute: 0.5 10*3/uL (ref 0.1–0.9)
Monocytes: 8 %
Neutrophils Absolute: 3.3 10*3/uL (ref 1.4–7.0)
Neutrophils: 54 %
Platelets: 393 10*3/uL (ref 150–450)
RBC: 4.46 x10E6/uL (ref 3.77–5.28)
RDW: 15.3 % (ref 11.7–15.4)
WBC: 6.1 10*3/uL (ref 3.4–10.8)

## 2023-03-05 LAB — COMPREHENSIVE METABOLIC PANEL
ALT: 18 IU/L (ref 0–32)
AST: 11 IU/L (ref 0–40)
Albumin: 4 g/dL (ref 3.9–4.9)
Alkaline Phosphatase: 102 IU/L (ref 44–121)
BUN/Creatinine Ratio: 24 (ref 12–28)
BUN: 15 mg/dL (ref 8–27)
Bilirubin Total: 0.3 mg/dL (ref 0.0–1.2)
CO2: 23 mmol/L (ref 20–29)
Calcium: 9.7 mg/dL (ref 8.7–10.3)
Chloride: 104 mmol/L (ref 96–106)
Creatinine, Ser: 0.63 mg/dL (ref 0.57–1.00)
Globulin, Total: 3.5 g/dL (ref 1.5–4.5)
Glucose: 89 mg/dL (ref 70–99)
Potassium: 4.4 mmol/L (ref 3.5–5.2)
Sodium: 140 mmol/L (ref 134–144)
Total Protein: 7.5 g/dL (ref 6.0–8.5)
eGFR: 100 mL/min/{1.73_m2} (ref >59)

## 2023-03-05 LAB — IRON,TIBC AND FERRITIN PANEL
Ferritin: 471 ng/mL — ABNORMAL HIGH (ref 15–150)
Iron Saturation: 18 % (ref 15–55)
Iron: 45 ug/dL (ref 27–139)
Total Iron Binding Capacity: 256 ug/dL (ref 250–450)
UIBC: 211 ug/dL (ref 118–369)

## 2023-03-05 LAB — TSH+FREE T4
Free T4: 1.44 ng/dL (ref 0.82–1.77)
TSH: 6.14 u[IU]/mL — ABNORMAL HIGH (ref 0.450–4.500)

## 2023-03-10 ENCOUNTER — Other Ambulatory Visit: Payer: Self-pay | Admitting: Physician Assistant

## 2023-03-10 DIAGNOSIS — E039 Hypothyroidism, unspecified: Secondary | ICD-10-CM

## 2023-03-10 MED ORDER — LEVOTHYROXINE SODIUM 88 MCG PO TABS
88.0000 ug | ORAL_TABLET | Freq: Every day | ORAL | 3 refills | Status: DC
Start: 2023-03-10 — End: 2023-05-31

## 2023-03-22 ENCOUNTER — Other Ambulatory Visit: Payer: Self-pay | Admitting: Physician Assistant

## 2023-03-22 DIAGNOSIS — I10 Essential (primary) hypertension: Secondary | ICD-10-CM

## 2023-04-11 ENCOUNTER — Encounter: Payer: 59 | Admitting: Physician Assistant

## 2023-05-23 ENCOUNTER — Other Ambulatory Visit: Payer: Self-pay | Admitting: Physician Assistant

## 2023-05-23 DIAGNOSIS — M25649 Stiffness of unspecified hand, not elsewhere classified: Secondary | ICD-10-CM

## 2023-05-24 NOTE — Telephone Encounter (Signed)
Requested Prescriptions  Refused Prescriptions Disp Refills   meloxicam (MOBIC) 15 MG tablet [Pharmacy Med Name: MELOXICAM 15 MG TABLET] 45 tablet 1    Sig: TAKE 0.5 TABLETS (7.5 MG TOTAL) BY MOUTH DAILY AS NEEDED.     Analgesics:  COX2 Inhibitors Failed - 05/23/2023 11:40 AM      Failed - Manual Review: Labs are only required if the patient has taken medication for more than 8 weeks.      Passed - HGB in normal range and within 360 days    Hemoglobin  Date Value Ref Range Status  03/04/2023 11.5 11.1 - 15.9 g/dL Final         Passed - Cr in normal range and within 360 days    Creatinine, Ser  Date Value Ref Range Status  03/04/2023 0.63 0.57 - 1.00 mg/dL Final         Passed - HCT in normal range and within 360 days    Hematocrit  Date Value Ref Range Status  03/04/2023 36.0 34.0 - 46.6 % Final         Passed - AST in normal range and within 360 days    AST  Date Value Ref Range Status  03/04/2023 11 0 - 40 IU/L Final         Passed - ALT in normal range and within 360 days    ALT  Date Value Ref Range Status  03/04/2023 18 0 - 32 IU/L Final         Passed - eGFR is 30 or above and within 360 days    GFR calc Af Amer  Date Value Ref Range Status  04/22/2020 108 >59 mL/min/1.73 Final    Comment:    **[facility] currently reports eGFR in compliance with the current**   recommendations of the [employer]. [facility] will   update reporting as new guidelines are published from the NKF-ASN   Task force.    GFR calc non Af Amer  Date Value Ref Range Status  04/22/2020 94 >59 mL/min/1.73 Final   eGFR  Date Value Ref Range Status  03/04/2023 100 >59 mL/min/1.73 Final         Passed - Patient is not pregnant      Passed - Valid encounter within last 12 months    Recent Outpatient Visits           2 months ago Annual physical exam   [facility] [name], PA-C   7 months ago Essential hypertension   [place]  [facility] [name], PA-C   1 year ago Cold extremities   [facility] [name], PA-C   1 year ago Encounter for health maintenance examination   [facility] [name], PA-C   1 year ago Stiffness of hand joint, unspecified laterality   [facility] [name], PA-C       Future Appointments             In 3 months Simmons-Robinson, Tawanna Cooler, MD [facility]

## 2023-05-31 ENCOUNTER — Other Ambulatory Visit: Payer: Self-pay | Admitting: Family Medicine

## 2023-05-31 DIAGNOSIS — I10 Essential (primary) hypertension: Secondary | ICD-10-CM

## 2023-05-31 DIAGNOSIS — E039 Hypothyroidism, unspecified: Secondary | ICD-10-CM

## 2023-05-31 NOTE — Telephone Encounter (Signed)
Medication Refill - Medication: levothyroxine (SYNTHROID) 88 MCG tablet [[id number]] atenolol (TENORMIN) 50 MG tablet [[id number]]   Has the patient contacted their pharmacy? Yes.     (Agent: If yes, when and what did the pharmacy advise?) Contact PCP   Preferred Pharmacy (with phone number or street name): [id number] [place], [place] - [address]   Has the patient been seen for an appointment in the last year OR does the patient have an upcoming appointment? Yes.    Agent: Please be advised that RX refills may take up to 3 business days. We ask that you follow-up with your pharmacy.

## 2023-06-01 MED ORDER — ATENOLOL 50 MG PO TABS
50.0000 mg | ORAL_TABLET | Freq: Every day | ORAL | 0 refills | Status: DC
Start: 2023-06-01 — End: 2023-11-03

## 2023-06-01 MED ORDER — LEVOTHYROXINE SODIUM 88 MCG PO TABS
88.0000 ug | ORAL_TABLET | Freq: Every day | ORAL | 0 refills | Status: DC
Start: 2023-06-01 — End: 2023-09-02

## 2023-06-01 NOTE — Telephone Encounter (Signed)
Requested medication (s) are due for refill today: No  Requested medication (s) are on the active medication list: Yes  Last refill:  03/22/23  Future visit scheduled: Yes  Notes to clinic:  Pharmacy requests refills to keep on file.    Requested Prescriptions  Pending Prescriptions Disp Refills   atenolol (TENORMIN) 50 MG tablet 90 tablet 1    Sig: Take 1 tablet (50 mg total) by mouth daily.     Cardiovascular: Beta Blockers 2 Passed - 05/31/2023 10:01 AM      Passed - Cr in normal range and within 360 days    Creatinine, Ser  Date Value Ref Range Status  03/04/2023 0.63 0.57 - 1.00 mg/dL Final         Passed - Last BP in normal range    BP Readings from Last 1 Encounters:  03/03/23 126/63         Passed - Last Heart Rate in normal range    Pulse Readings from Last 1 Encounters:  03/03/23 63         Passed - Valid encounter within last 6 months    Recent Outpatient Visits           3 months ago Annual physical exam   [facility] [name], PA-C   7 months ago Essential hypertension   [facility] [name], PA-C   1 year ago Cold extremities   [place] [facility] [name], PA-C   1 year ago Encounter for health maintenance examination   [facility] [name], PA-C   1 year ago Stiffness of hand joint, unspecified laterality   [facility] [name], PA-C       Future Appointments             In 3 months Simmons-Robinson, Tawanna Cooler, MD [facility]             levothyroxine (SYNTHROID) 88 MCG tablet 90 tablet 3    Sig: Take 1 tablet (88 mcg total) by mouth daily.     Endocrinology:  Hypothyroid Agents Failed - 05/31/2023 10:01 AM      Failed - TSH in normal range and within 360 days    TSH  Date Value Ref Range Status  03/04/2023 6.140 (H) 0.450 - 4.500 uIU/mL Final          Passed - Valid encounter within last 12 months    Recent Outpatient Visits           3 months ago Annual physical exam   [facility] [name], PA-C   7 months ago Essential hypertension   [facility] [name], PA-C   1 year ago Cold extremities   [facility] [name], PA-C   1 year ago Encounter for health maintenance examination   [facility] [name], PA-C   1 year ago Stiffness of hand joint, unspecified laterality   [facility] [name], PA-C       Future Appointments             In 3 months Simmons-Robinson, Tawanna Cooler, MD [facility]

## 2023-07-17 IMAGING — MG MM DIGITAL SCREENING BILAT W/ TOMO AND CAD
6 of 10 series · 6 of 30 positions shown · non-contrast
Comparison: Previous exam(s).

CLINICAL DATA: Screening.

EXAM:
DIGITAL SCREENING BILATERAL MAMMOGRAM WITH TOMOSYNTHESIS AND CAD
TECHNIQUE: Bilateral screening digital craniocaudal and mediolateral oblique
mammograms were obtained. Bilateral screening digital breast
tomosynthesis was performed. The images were evaluated with
computer-aided detection.

[R MLO synth-2D (1 of 2)]
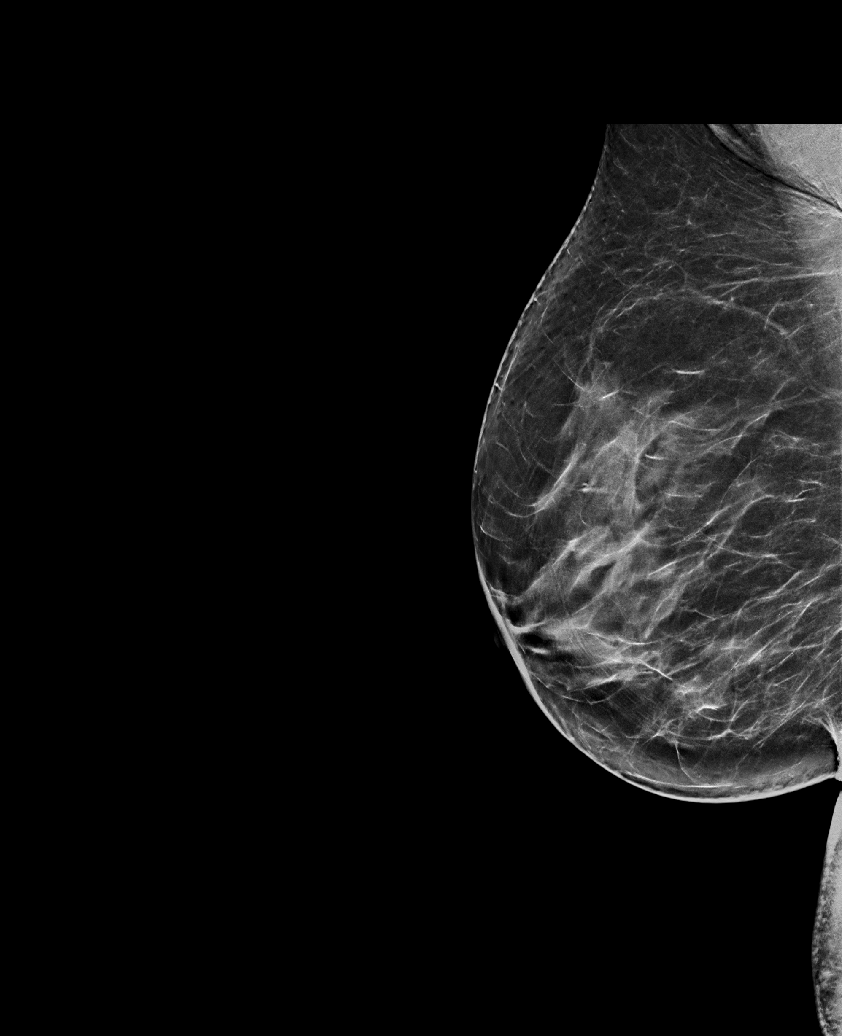

[R MLO synth-2D (2 of 2)]
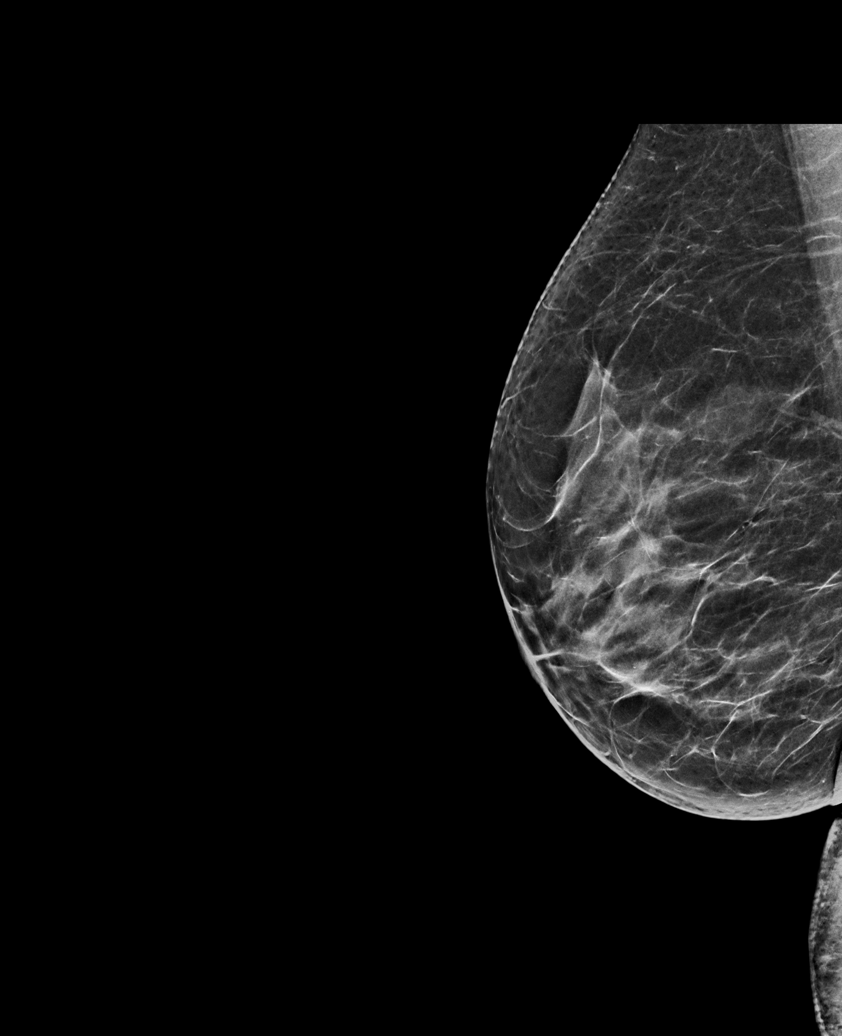

[L MLO synth-2D]
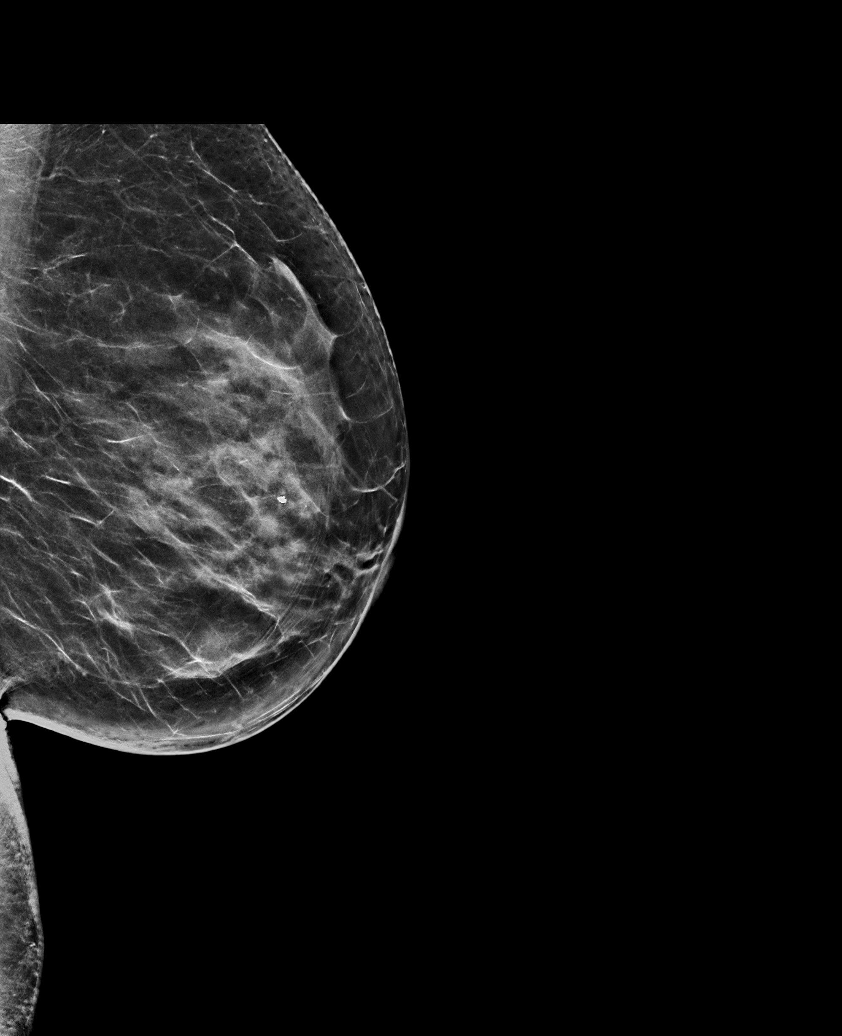

[L CC synth-2D]
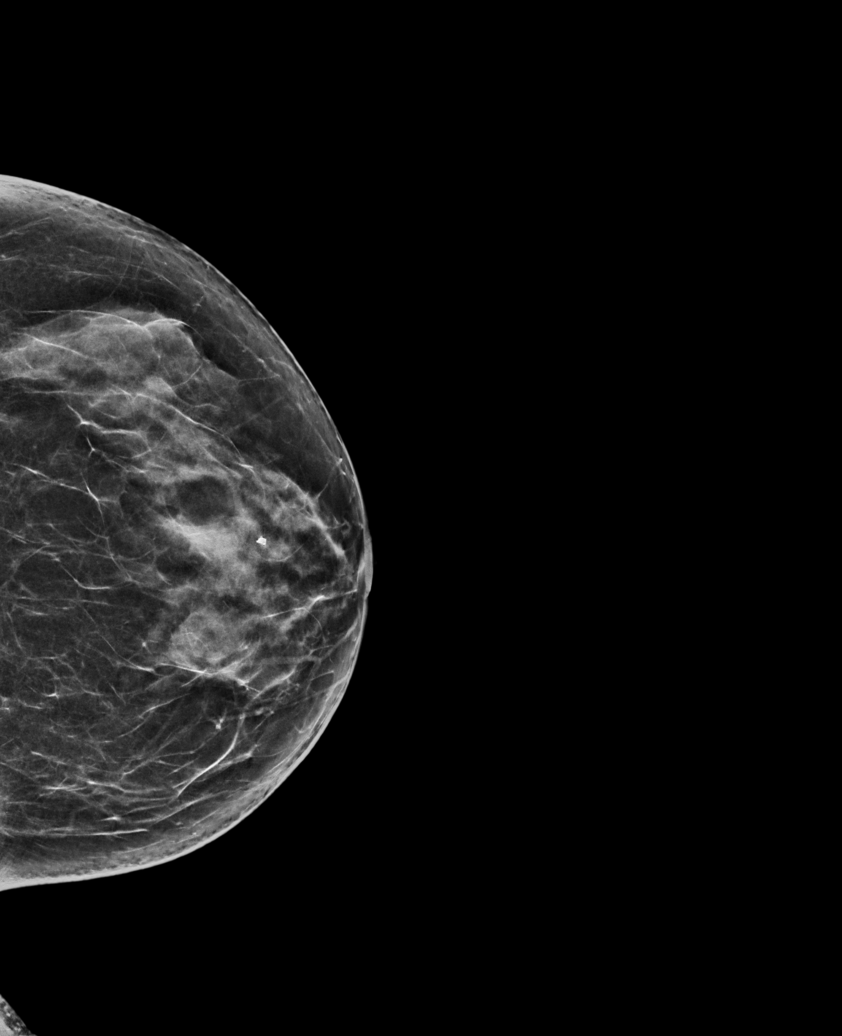

[R CC synth-2D]
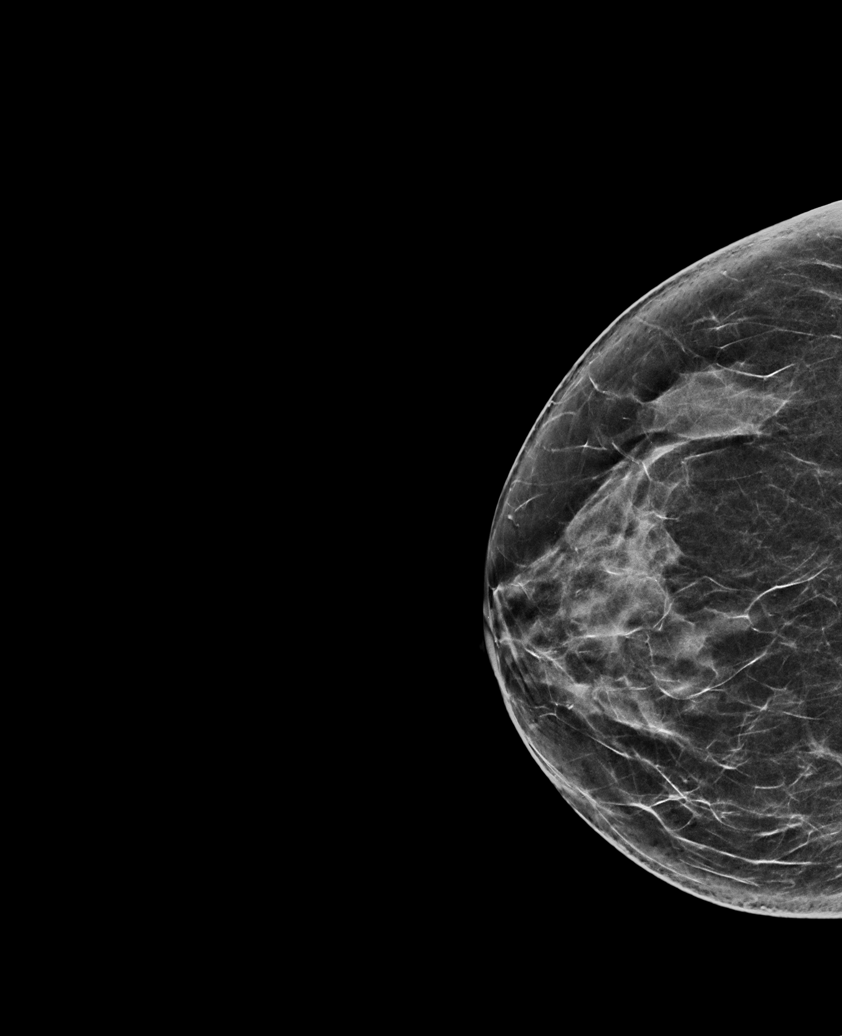

[L MLO tomo · tomo slice 35/69.0]
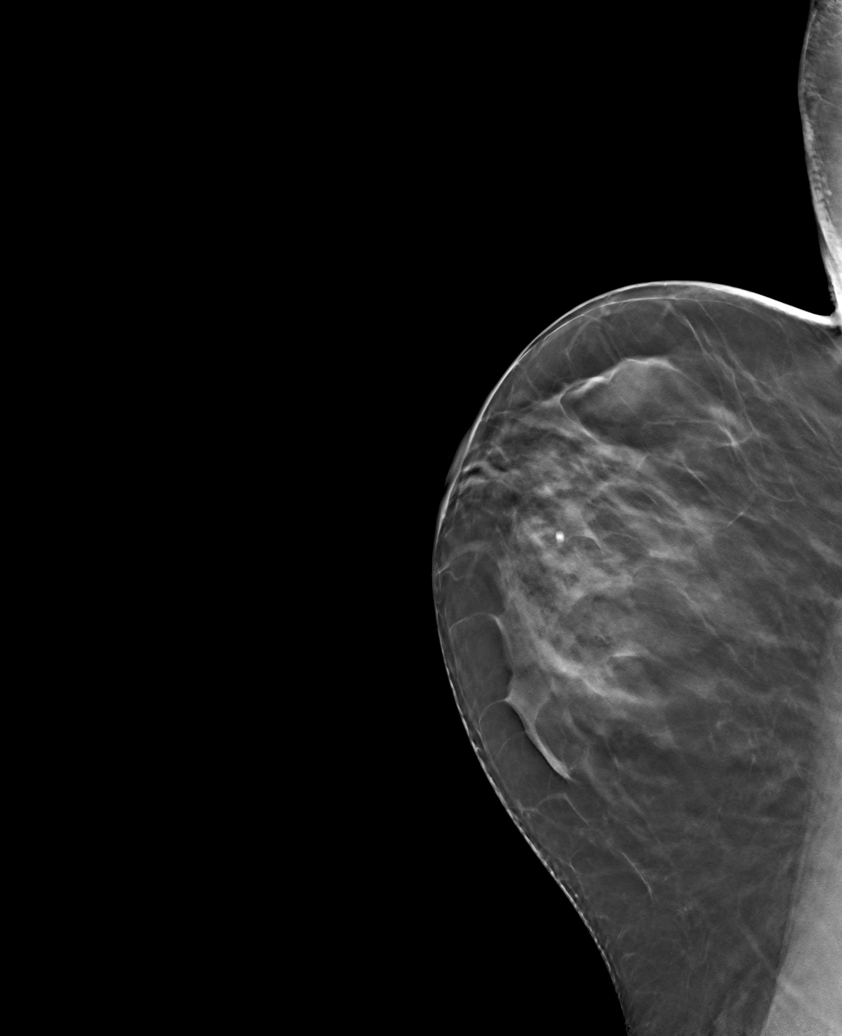

[6 of 30 positions shown; findings below may reference images not displayed]

ACR Breast Density Category c: The breast tissue is heterogeneously
dense, which may obscure small masses.
FINDINGS: There are no findings suspicious for malignancy.
IMPRESSION: No mammographic evidence of malignancy. A result letter of this
screening mammogram will be mailed directly to the patient.

RECOMMENDATION:
Screening mammogram in one year. (Code:Q3-W-BC3)

BI-RADS CATEGORY  1: Negative.

## 2023-08-15 ENCOUNTER — Ambulatory Visit
Admission: RE | Admit: 2023-08-15 | Discharge: 2023-08-15 | Disposition: A | Discharge status: Home / Self Care | Payer: Medicaid Other | Source: Ambulatory Visit | Attending: Physician Assistant | Admitting: Physician Assistant

## 2023-08-15 DIAGNOSIS — Z1231 Encounter for screening mammogram for malignant neoplasm of breast: Secondary | ICD-10-CM | POA: Diagnosis not present

## 2023-08-29 NOTE — Progress Notes (Signed)
Established patient visit   Patient: Cynthia Blankenship   DOB: 07-21-1961   62 y.o. Female  MRN: [id number] Visit Date: 08/30/2023  Today's healthcare provider: Ronnald Ramp, MD   Chief Complaint  Patient presents with   Hypertension    Patient checks her blood pressure outside of the office but does not have record with her  She does reports getting readings of 153/82 on first try and then 141/81.  She reports good compliance and tolerance of the medication.    Osteoarthritis    Patient takes Meloxicam for this since the summer of 2023.  She has cut the amount she takes to about 3 per week.  She states she is due for her kidney function check    Hypothyroidism    Patient had labs 6 months ago and adjustment of medication was made to have her taking 88 mcg daily.  She has not had follow up level since medicine was changed   Subjective     HPI     Hypertension    Additional comments: Patient checks her blood pressure outside of the office but does not have record with her  She does reports getting readings of 153/82 on first try and then 141/81.  She reports good compliance and tolerance of the medication.         Osteoarthritis    Additional comments: Patient takes Meloxicam for this since the summer of 2023.  She has cut the amount she takes to about 3 per week.  She states she is due for her kidney function check         Hypothyroidism    Additional comments: Patient had labs 6 months ago and adjustment of medication was made to have her taking 88 mcg daily.  She has not had follow up level since medicine was changed      Last edited by Adline Peals, CMA on 08/30/2023  8:15 AM.       Discussed the use of AI scribe software for clinical note transcription with the patient, who gave verbal consent to proceed.  History of Present Illness   The patient, a 62 year old individual with a history of hypertension, osteoarthritis, and hypothyroidism,  presents for a follow-up visit. The primary concern is uncontrolled hypertension, with recent readings consistently in the 140s systolic and 70s diastolic. The patient reports taking Atenolol 50mg  daily during lunch, but did not take the medication on the day of the visit.  In addition to hypertension, the patient is managing hypothyroidism with daily Synthroid . The patient did not bring any records of recent blood pressure measurements.  Regarding the osteoarthritis, the patient experiences decreased strength bilaterally and is currently managed with Meloxicam 7.5mg  daily as needed. Despite these chronic conditions, the patient exhibits no lower extremity edema and has a regular cardiac rate and rhythm with no murmurs. The pulmonary exam reveals clear bilateral breath sounds with no wheezing or crackles.      She reports some stiffness of her upper extremities in the mornings    Past Medical History:  Diagnosis Date   Hypertension    Thyroid condition     Medications: Outpatient Medications Prior to Visit  Medication Sig   atenolol (TENORMIN) 50 MG tablet Take 1 tablet (50 mg total) by mouth daily.   chlorpheniramine-HYDROcodone (TUSSIONEX) 10-8 MG/5ML Take 5 mLs by mouth at bedtime as needed for cough.   cholecalciferol (VITAMIN D3) 25 MCG (1000 UNIT) tablet Take 1,000 Units by  mouth daily.   [facility] IRON 325 (65 Fe) MG tablet TAKE 325 MG BY MOUTH DAILY.   levothyroxine (SYNTHROID) 88 MCG tablet Take 1 tablet (88 mcg total) by mouth daily.   meloxicam (MOBIC) 15 MG tablet TAKE 0.5 TABLETS (7.5 MG TOTAL) BY MOUTH DAILY AS NEEDED.   Multiple Vitamin (MULTIVITAMIN) tablet Take 1 tablet by mouth daily.   No facility-administered medications prior to visit.    Review of Systems  Last metabolic panel Lab Results  Component Value Date   GLUCOSE 89 03/04/2023   NA 140 03/04/2023   K 4.4 03/04/2023   CL 104 03/04/2023   CO2 23 03/04/2023   BUN 15 03/04/2023   CREATININE 0.63  03/04/2023   EGFR 100 03/04/2023   CALCIUM 9.7 03/04/2023   PROT 7.5 03/04/2023   ALBUMIN 4.0 03/04/2023   LABGLOB 3.5 03/04/2023   AGRATIO 1.2 03/08/2022   BILITOT 0.3 03/04/2023   ALKPHOS 102 03/04/2023   AST 11 03/04/2023   ALT 18 03/04/2023   Last thyroid functions Lab Results  Component Value Date   TSH 6.140 (H) 03/04/2023        Objective    BP (!) 147/70 (BP Location: Right Arm, Patient Position: Sitting, Cuff Size: Normal)   Pulse 86   Temp 98.3 F (36.8 C) (Oral)   Ht 5' (1.524 m)   Wt 130 lb (59 kg)   SpO2 97%   BMI 25.39 kg/m   BP Readings from Last 3 Encounters:  08/30/23 (!) 147/70  03/03/23 126/63  10/11/22 135/76   Wt Readings from Last 3 Encounters:  08/30/23 130 lb (59 kg)  03/03/23 128 lb 8 oz (58.3 kg)  10/11/22 129 lb 9.6 oz (58.8 kg)       Physical Exam  General: Alert, no acute distress Cardio: Normal S1 and S2, RRR, no r/m/g Pulm: CTAB, normal work of breathing Abdomen: Bowel sounds normal.  Extremities: No peripheral edema. Decreased bilateral grip strength, no enlarged joints   No results found for any visits on 08/30/23.  Assessment & Plan     Problem List Items Addressed This Visit       Cardiovascular and Mediastinum   Essential hypertension - Primary   Chronic  Reports chronically elevated at 140s/70s  Recommended taking BP meds earlier in the AM and measuring BP with goal of less than 130/80 CMP ordered today  Continue atenolol 50mg  daily         Endocrine   Adult hypothyroidism   Chronic  Stable  Measure Tsh,T4 and T3 levels today  Continue synthroid daily       Relevant Orders   TSH     Musculoskeletal and Integument   Arthritis of both hands     Other   Polyarthralgia   Chronic  Well controlled on meloxicam 7.5mg  daily PRN  Continue current regimen       Other Visit Diagnoses       Encounter for long-term (current) use of high-risk medication       Relevant Orders   Comprehensive  metabolic panel        Return in about 1 month (around 09/30/2023) for HTN.         Ronnald Ramp, MD  [facility] [phone] (phone) [phone] (fax)  [facility]

## 2023-08-30 ENCOUNTER — Ambulatory Visit: Payer: Medicaid Other | Admitting: Family Medicine

## 2023-08-30 ENCOUNTER — Encounter: Payer: Self-pay | Admitting: Family Medicine

## 2023-08-30 VITALS — BP 147/70 | HR 86 | Temp 98.3°F | Ht 60.0 in | Wt 130.0 lb

## 2023-08-30 DIAGNOSIS — M19042 Primary osteoarthritis, left hand: Secondary | ICD-10-CM | POA: Diagnosis not present

## 2023-08-30 DIAGNOSIS — I10 Essential (primary) hypertension: Secondary | ICD-10-CM | POA: Diagnosis not present

## 2023-08-30 DIAGNOSIS — M19041 Primary osteoarthritis, right hand: Secondary | ICD-10-CM

## 2023-08-30 DIAGNOSIS — M2550 Pain in unspecified joint: Secondary | ICD-10-CM | POA: Diagnosis not present

## 2023-08-30 DIAGNOSIS — E039 Hypothyroidism, unspecified: Secondary | ICD-10-CM

## 2023-08-30 DIAGNOSIS — Z79899 Other long term (current) drug therapy: Secondary | ICD-10-CM

## 2023-08-30 NOTE — Assessment & Plan Note (Addendum)
Chronic  Reports chronically elevated at 140s/70s  Recommended taking BP meds earlier in the AM and measuring BP with goal of less than 130/80 CMP ordered today  Continue atenolol 50mg  daily

## 2023-08-30 NOTE — Patient Instructions (Addendum)
It was a pleasure to see you today!  Thank you for choosing [facility] for your primary care.   Continue your atenolol 50mg  daily   Please start taking your blood pressure medication with breakfast and then measure your blood pressure at least 1 hour after your medication    Goal blood pressure is less than 130/80  Please record your blood pressures for [place] to review when I see you in 1 month   To keep you healthy, please keep in mind the following health maintenance items that you are due for:   1.Shingrix - second  2. Influenza vaccine   See me in 1 month   Best Wishes,   Dr. Roxan Hockey

## 2023-08-30 NOTE — Assessment & Plan Note (Signed)
Chronic  Stable  Measure Tsh,T4 and T3 levels today  Continue synthroid daily

## 2023-08-30 NOTE — Assessment & Plan Note (Signed)
Chronic  Well controlled on meloxicam 7.5mg  daily PRN  Continue current regimen

## 2023-08-31 LAB — COMPREHENSIVE METABOLIC PANEL
ALT: 16 [IU]/L (ref 0–32)
AST: 19 [IU]/L (ref 0–40)
Albumin: 4 g/dL (ref 3.9–4.9)
Alkaline Phosphatase: 112 [IU]/L (ref 44–121)
BUN/Creatinine Ratio: 30 — ABNORMAL HIGH (ref 12–28)
BUN: 17 mg/dL (ref 8–27)
Bilirubin Total: 0.4 mg/dL (ref 0.0–1.2)
CO2: 21 mmol/L (ref 20–29)
Calcium: 10 mg/dL (ref 8.7–10.3)
Chloride: 104 mmol/L (ref 96–106)
Creatinine, Ser: 0.57 mg/dL (ref 0.57–1.00)
Globulin, Total: 3.5 g/dL (ref 1.5–4.5)
Glucose: 100 mg/dL — ABNORMAL HIGH (ref 70–99)
Potassium: 4.4 mmol/L (ref 3.5–5.2)
Sodium: 139 mmol/L (ref 134–144)
Total Protein: 7.5 g/dL (ref 6.0–8.5)
eGFR: 103 mL/min/{1.73_m2} (ref >59)

## 2023-08-31 LAB — TSH: TSH: 0.244 u[IU]/mL — ABNORMAL LOW (ref 0.450–4.500)

## 2023-09-02 ENCOUNTER — Encounter: Payer: Self-pay | Admitting: Family Medicine

## 2023-09-02 ENCOUNTER — Ambulatory Visit: Payer: Self-pay

## 2023-09-02 MED ORDER — LEVOTHYROXINE SODIUM 75 MCG PO TABS: 75.0000 ug | ORAL_TABLET | Freq: Every day | ORAL | 3 refills | Status: DC

## 2023-09-02 NOTE — Telephone Encounter (Signed)
Patient called, left VM to return the call to the office to speak to the NT.    Summary: RSV Vaccine:  2 do or not 2 do??   Pt states [facility] and asked if she would be interested in a RSV vaccine.  Pt wants to know if this is a good idea.

## 2023-09-02 NOTE — Telephone Encounter (Signed)
Chief Complaint: Vaccine Question   Disposition: [] ED /[] Urgent Care (no appt availability in office) / [] Appointment(In office/virtual)/ []  [facility]/ [] Home Care/ [] Refused Recommended Disposition /[] [place] [facility]/ [x]  Follow-up with PCP Additional Notes: [facility] stated [facility] to offer her the RSV vaccine and patient wanted to know if her PCP recommends she get the RSV vaccine at this time. Generally speaking patient advised that the RSV vaccine is recommended for adults age 32 and older with certain underlying medical conditions such as chronic heart or lung disease, weakened immune system, diabetes or kidney disease. Care advice given and advised patient I would forward message to PCP for additional recommendations. Patient verbalized understanding.  Summary: RSV Vaccine:  2 do or not 2 do??   [facility] states [facility] and asked if she would be interested in a RSV vaccine.  Pt wants to know if this is a good idea.     Reason for Disposition  [1] Caller requesting NON-URGENT health information AND [2] PCP's office is the best resource  Answer Assessment - Initial Assessment Questions 1. REASON FOR CALL or QUESTION: "What is your reason for calling today?" or "How can I best help you?" or "What question do you have that I can help answer?"     Is it a good idea for me to get the RSV vaccine?  Protocols used: Information Only Call - No Triage-A-AH

## 2023-09-02 NOTE — Telephone Encounter (Signed)
Agree with counseling provided to patient previously   Would recommend RSV vaccination for additional protection during respiratory infection season  Ronnald Ramp, MD

## 2023-09-02 NOTE — Telephone Encounter (Signed)
I called patient via results to share lab results.   She verbalized understanding.  Patient feels like the Synthroid at higher doses has given her side effects of dizziness. She was relieved to lower the dose. She reports she had similar dizziness when Dr. Sullivan Lone increased her synthroid. I instructed her to monitor these symptoms as she changes the medication. If she does not see improvement (like she did before), we should evaluate her in office to determine if there is another cause for her symptoms. Patient verbalized understanding and will contact us if symptoms persist.

## 2023-09-02 NOTE — Addendum Note (Signed)
Addended by: Bing Neighbors on: 09/02/2023 09:20 AM   Modules accepted: Orders

## 2023-09-05 NOTE — Telephone Encounter (Signed)
Attempted to call patient. Received VM. Left message for her to call back.  PEC you may advise Dr. Danella Penton RSV vaccine recommendation as below.

## 2023-09-05 NOTE — Telephone Encounter (Signed)
Spoke to patient to relay Dr. Danella Penton comments as documented below. Patient verbalized understanding and stated she plans to get the RSV vaccine soon.    Ronnald Ramp, MD  Phys[facility] Family [facility]   Telephone Encounter Signed   Creation Time: 09/02/2023 11:55 AM   Signed     Agree with counseling provided to patient previously    Would recommend RSV vaccination for additional protection during respiratory infection season   Ronnald Ramp, MD

## 2023-09-06 ENCOUNTER — Other Ambulatory Visit: Payer: Self-pay | Admitting: Physician Assistant

## 2023-09-06 DIAGNOSIS — M25649 Stiffness of unspecified hand, not elsewhere classified: Secondary | ICD-10-CM

## 2023-09-08 ENCOUNTER — Other Ambulatory Visit: Payer: Self-pay | Admitting: Family Medicine

## 2023-09-08 ENCOUNTER — Telehealth: Payer: Self-pay

## 2023-09-08 NOTE — Telephone Encounter (Signed)
Copied from CRM [id number]. Topic: General - Other >> Sep 06, 2023 11:46 AM Cynthia Blankenship wrote: Reason for CRM: The patient has called to request an alternative prescription to their current [facility] IRON 325 (65 Fe) MG tablet [[id number]] prescription   The patient would like to be prescribed something to help with their iron that will be covered by their insurance   Please contact the patient further if needed

## 2023-09-12 ENCOUNTER — Other Ambulatory Visit: Payer: Self-pay | Admitting: Family Medicine

## 2023-09-12 DIAGNOSIS — M25649 Stiffness of unspecified hand, not elsewhere classified: Secondary | ICD-10-CM

## 2023-09-12 DIAGNOSIS — D509 Iron deficiency anemia, unspecified: Secondary | ICD-10-CM

## 2023-09-12 NOTE — Telephone Encounter (Unsigned)
Copied from CRM [id number]. Topic: General - Other >> Sep 12, 2023  9:23 AM Everette C wrote: Reason for CRM: Medication Refill - Most Recent Primary Care Visit:  Provider: Ronnald Ramp Department: [facility] Visit Type: OFFICE VISIT Date: 08/30/2023  Medication: meloxicam (MOBIC) 15 MG tablet [[id number]]  [facility] IRON 325 (65 Fe) MG tablet [[id number]]  Has the patient contacted their pharmacy? Yes (Agent: If no, request that the patient contact the pharmacy for the refill. If patient does not wish to contact the pharmacy document the reason why and proceed with request.) (Agent: If yes, when and what did the pharmacy advise?)  Is this the correct pharmacy for this prescription? Yes If no, delete pharmacy and type the correct one.  This is the patient's preferred pharmacy:  [id number] - [address] [place] [address] Phone: [phone] Fax: [phone]   Has the prescription been filled recently? Yes  Is the patient out of the medication? Yes  Has the patient been seen for an appointment in the last year OR does the patient have an upcoming appointment? Yes  Can we respond through MyChart? No  Agent: Please be advised that Rx refills may take up to 3 business days. We ask that you follow-up with your pharmacy.

## 2023-10-03 ENCOUNTER — Encounter: Payer: Self-pay | Admitting: Family Medicine

## 2023-10-03 ENCOUNTER — Ambulatory Visit: Payer: Medicaid Other | Admitting: Family Medicine

## 2023-10-03 VITALS — BP 179/81 | HR 76 | Temp 97.9°F | Ht 60.0 in | Wt 130.7 lb

## 2023-10-03 DIAGNOSIS — G629 Polyneuropathy, unspecified: Secondary | ICD-10-CM

## 2023-10-03 DIAGNOSIS — E039 Hypothyroidism, unspecified: Secondary | ICD-10-CM

## 2023-10-03 DIAGNOSIS — I10 Essential (primary) hypertension: Secondary | ICD-10-CM | POA: Diagnosis not present

## 2023-10-03 MED ORDER — AMLODIPINE BESYLATE 5 MG PO TABS
5.0000 mg | ORAL_TABLET | Freq: Every day | ORAL | 1 refills | Status: DC
Start: 2023-10-03 — End: 2023-10-18

## 2023-10-03 NOTE — Progress Notes (Signed)
Established patient visit   Patient: Cynthia Blankenship   DOB: 1960-10-16   63 y.o. Female  MRN: [id number] Visit Date: 10/03/2023  Today's healthcare provider: Ronnald Ramp, MD   Chief Complaint  Patient presents with   Hypertension   Subjective       Discussed the use of AI scribe software for clinical note transcription with the patient, who gave verbal consent to proceed.  History of Present Illness   The patient, a 63 year old with a history of hypertension, presented for a follow-up visit. She has been on a regimen of Atenolol 50mg  daily. However, she reported that her blood pressure readings at home have been fluctuating, with some readings in the 130s and 140s, and occasional readings in the 150s. She also reported a recent cold in December, during which she took over-the-counter medications and noticed her blood pressure was "all over the place."  In addition to hypertension, the patient has been on Levothyroxine for hypothyroidism. She reported feeling better since the dose was adjusted from . She also reported experiencing tingling in her feet and foot cramping, which she was concerned might be related to diabetes or vitamin B12 deficiency. She requested to have her A1c and vitamin B12 levels checked.  The patient also reported having orthotics for her shoes and experiencing some relief from foot discomfort when using them. She mentioned that she tries to maintain an active lifestyle, including walking and stretching exercises. She also reported making dietary changes, including eating fruits, avocados, scrambled eggs, and bakery bread with light extra olive oil.  The patient has been taking Meloxicam as needed, approximately three times a week, for joint pain. She reported that her symptoms are better in the summer. She also mentioned a possible diagnosis of carpal tunnel, tendonitis, and arthritis, and showed a raised vein on her hand.       Past  Medical History:  Diagnosis Date   Hypertension    Thyroid condition     Medications: Outpatient Medications Prior to Visit  Medication Sig   atenolol (TENORMIN) 50 MG tablet Take 1 tablet (50 mg total) by mouth daily.   chlorpheniramine-HYDROcodone (TUSSIONEX) 10-8 MG/5ML Take 5 mLs by mouth at bedtime as needed for cough.   cholecalciferol (VITAMIN D3) 25 MCG (1000 UNIT) tablet Take 1,000 Units by mouth daily.   [facility] IRON 325 (65 Fe) MG tablet TAKE 325 MG BY MOUTH DAILY.   levothyroxine (SYNTHROID) 75 MCG tablet Take 1 tablet (75 mcg total) by mouth daily.   meloxicam (MOBIC) 15 MG tablet TAKE 0.5 TABLETS (7.5 MG TOTAL) BY MOUTH DAILY AS NEEDED.   Multiple Vitamin (MULTIVITAMIN) tablet Take 1 tablet by mouth daily.   No facility-administered medications prior to visit.    Review of Systems    Chemistry      Component Value Date/Time   NA 139 08/30/2023 0854   K 4.4 08/30/2023 0854   CL 104 08/30/2023 0854   CO2 21 08/30/2023 0854   BUN 17 08/30/2023 0854   CREATININE 0.57 08/30/2023 0854   GLU 99 02/11/2014 0000      Component Value Date/Time   CALCIUM 10.0 08/30/2023 0854   ALKPHOS 112 08/30/2023 0854   AST 19 08/30/2023 0854   ALT 16 08/30/2023 0854   BILITOT 0.4 08/30/2023 0854       Last thyroid functions Lab Results  Component Value Date   TSH 0.244 (L) 08/30/2023    No results found for: "HGBA1C"  Objective    BP (!) 179/81 (BP Location: Left Arm, Patient Position: Sitting, Cuff Size: Normal)   Pulse 76   Temp 97.9 F (36.6 C) (Oral)   Ht 5' (1.524 m)   Wt 130 lb 11.2 oz (59.3 kg)   SpO2 100%   BMI 25.53 kg/m  BP Readings from Last 3 Encounters:  10/03/23 (!) 179/81  08/30/23 (!) 147/70  03/03/23 126/63   Wt Readings from Last 3 Encounters:  10/03/23 130 lb 11.2 oz (59.3 kg)  08/30/23 130 lb (59 kg)  03/03/23 128 lb 8 oz (58.3 kg)        Physical Exam  General: Alert, no acute distress Cardio: Normal S1 and S2, RRR, no  r/m/g Pulm: CTAB, normal work of breathing EXTRM: no LE edema, normal ROM of lower    No results found for any visits on 10/03/23.   Assessment & Plan     Problem List Items Addressed This Visit       Cardiovascular and Mediastinum   Essential hypertension - Primary   Chronic hypertension with current readings elevated at 179/81 mmHg. Home readings range from 130s-140s, with occasional spikes to 150s. Current regimen includes atenolol 50 mg daily. Recent cold and over-the-counter medications may have affected readings. Decision made to add amlodipine to better control blood pressure. Discussed risks of dizziness and swelling, and benefits of achieving consistent readings in the 130s to reduce cardiovascular risk. - Prescribe amlodipine 5 mg daily - Continue atenolol 50 mg daily - Recheck blood pressure in 2 weeks      Relevant Medications   amLODipine (NORVASC) 5 MG tablet   Other Relevant Orders   Basic Metabolic Panel (BMET)     Endocrine   Adult hypothyroidism   On levothyroxine 75 mcg daily, previously on 88 mcg but experienced shakiness and dizziness. Current dose well-tolerated. Discussed importance of regular monitoring to avoid symptoms and maintain optimal thyroid function. Chronic  - Check thyroid levels -continue Synthroid daily       Relevant Orders   TSH + free T4   Other Visit Diagnoses       Neuropathy       Relevant Orders   Hemoglobin A1c   Vitamin B12      Lab Results  Component Value Date   VITAMINB12 187 (L) 03/28/2019       Neuropathy Reports tingling and cramping in feet, possibly related to neuropathy. Symptoms improved with orthotic inserts. Differential diagnosis includes vitamin B12 deficiency and diabetes. Discussed potential outcomes and treatment options including gabapentin if labs are normal, and the importance of addressing any deficiencies found. - Check A1c - Check vitamin B12 levels - Consider gabapentin if labs are  normal   General Health Maintenance Efforts to maintain a healthy diet and exercise routine despite weather limitations. Received RSV vaccine and tolerated it well. - Encourage continued healthy diet and exercise - Follow up in 2 weeks for blood pressure check and lab results     Return in about 2 weeks (around 10/17/2023) for HTN,neuropathy .        Ronnald Ramp, MD  [facility] [phone] (phone) [phone] (fax)  [facility]

## 2023-10-03 NOTE — Assessment & Plan Note (Signed)
Chronic hypertension with current readings elevated at 179/81 mmHg. Home readings range from 130s-140s, with occasional spikes to 150s. Current regimen includes atenolol 50 mg daily. Recent cold and over-the-counter medications may have affected readings. Decision made to add amlodipine to better control blood pressure. Discussed risks of dizziness and swelling, and benefits of achieving consistent readings in the 130s to reduce cardiovascular risk. - Prescribe amlodipine 5 mg daily - Continue atenolol 50 mg daily - Recheck blood pressure in 2 weeks

## 2023-10-03 NOTE — Assessment & Plan Note (Signed)
On levothyroxine 75 mcg daily, previously on 88 mcg but experienced shakiness and dizziness. Current dose well-tolerated. Discussed importance of regular monitoring to avoid symptoms and maintain optimal thyroid function. Chronic  - Check thyroid levels -continue Synthroid daily

## 2023-10-04 ENCOUNTER — Encounter: Payer: Self-pay | Admitting: Family Medicine

## 2023-10-04 LAB — VITAMIN B12: Vitamin B-12: 211 pg/mL — ABNORMAL LOW (ref 232–1245)

## 2023-10-04 LAB — BASIC METABOLIC PANEL
BUN/Creatinine Ratio: 16 (ref 12–28)
BUN: 11 mg/dL (ref 8–27)
CO2: 21 mmol/L (ref 20–29)
Calcium: 9.9 mg/dL (ref 8.7–10.3)
Chloride: 102 mmol/L (ref 96–106)
Creatinine, Ser: 0.67 mg/dL (ref 0.57–1.00)
Glucose: 95 mg/dL (ref 70–99)
Potassium: 4.5 mmol/L (ref 3.5–5.2)
Sodium: 138 mmol/L (ref 134–144)
eGFR: 99 mL/min/{1.73_m2} (ref >59)

## 2023-10-04 LAB — HEMOGLOBIN A1C
Est. average glucose Bld gHb Est-mCnc: 123 mg/dL
Hgb A1c MFr Bld: 5.9 % — ABNORMAL HIGH (ref 4.8–5.6)

## 2023-10-04 LAB — TSH+FREE T4
Free T4: 1.75 ng/dL (ref 0.82–1.77)
TSH: 1.95 u[IU]/mL (ref 0.450–4.500)

## 2023-10-18 ENCOUNTER — Ambulatory Visit: Payer: Medicaid Other | Admitting: Family Medicine

## 2023-10-18 ENCOUNTER — Encounter: Payer: Self-pay | Admitting: Family Medicine

## 2023-10-18 VITALS — BP 150/68 | HR 88 | Resp 16 | Ht 60.0 in | Wt 129.1 lb

## 2023-10-18 DIAGNOSIS — G629 Polyneuropathy, unspecified: Secondary | ICD-10-CM

## 2023-10-18 DIAGNOSIS — M19049 Primary osteoarthritis, unspecified hand: Secondary | ICD-10-CM | POA: Diagnosis not present

## 2023-10-18 DIAGNOSIS — E039 Hypothyroidism, unspecified: Secondary | ICD-10-CM

## 2023-10-18 DIAGNOSIS — I10 Essential (primary) hypertension: Secondary | ICD-10-CM

## 2023-10-18 DIAGNOSIS — E782 Mixed hyperlipidemia: Secondary | ICD-10-CM | POA: Diagnosis not present

## 2023-10-18 MED ORDER — AMLODIPINE BESYLATE 5 MG PO TABS
10.0000 mg | ORAL_TABLET | Freq: Every day | ORAL | Status: DC
Start: 2023-10-18 — End: 2023-11-08

## 2023-10-18 MED ORDER — OLMESARTAN MEDOXOMIL 5 MG PO TABS: 5.0000 mg | ORAL_TABLET | Freq: Every day | ORAL | 1 refills | Status: DC

## 2023-10-18 NOTE — Assessment & Plan Note (Signed)
Chronic arthritis of bilateral hands, managed with meloxicam. Reports improvement in symptoms. - Continue meloxicam 15 mg daily

## 2023-10-18 NOTE — Assessment & Plan Note (Signed)
 Chronic essential hypertension. Home blood pressure readings have been variable. Current medications: amlodipine  5 mg and atenolol  50 mg. In-office readings: 152/75, 148/64, 160/78. Positive lifestyle changes noted. Discussed increasing amlodipine  to 10 mg daily for better control. Explained potential side effect of leg swelling and advised monitoring. If swelling occurs, reduce amlodipine  back to 5 mg. If blood pressure remains elevated after one week on increased amlodipine , start olmesartan  5 mg. Emphasized importance of home monitoring and reporting readings under 100/60 or symptoms of dizziness and lightheadedness. - Increase amlodipine  to 10 mg daily - Monitor for leg swelling; if swelling occurs, reduce amlodipine  back to 5 mg - Start olmesartan  5 mg if blood pressure remains elevated after one week on increased amlodipine  - Continue atenolol  50 mg daily - Continue home blood pressure monitoring - Schedule follow-up in 3 weeks

## 2023-10-18 NOTE — Assessment & Plan Note (Signed)
Moderate mixed hyperlipidemia, not currently requiring statin therapy. - Continue current management without statin

## 2023-10-18 NOTE — Assessment & Plan Note (Signed)
Chronic neuropathy, reports improvement with B12 supplementation. - Continue B12 supplementation

## 2023-10-18 NOTE — Progress Notes (Signed)
 Established patient visit   Patient: Cynthia Blankenship   DOB: 1961-01-12   63 y.o. Female  MRN: [id number] Visit Date: 10/18/2023  Today's healthcare provider: Rockie Agent, MD   Chief Complaint  Patient presents with   Hypertension   Peripheral Neuropathy   Subjective       Discussed the use of AI scribe software for clinical note transcription with the patient, who gave verbal consent to proceed.  History of Present Illness   Cynthia Blankenship is a 63 year old female with hypertension and hypothyroidism who presents for follow-up.  She is here for a follow-up regarding her hypertension. Blood pressure readings at home have varied, with some readings as low as 114/69 and others as high as 166/77. She is currently taking amlodipine  5 mg and atenolol  50 mg daily. She has made dietary changes, including reducing processed foods, which she believes have helped improve her blood pressure. She also engages in daily walking when the weather permits.  Her hypothyroidism is well-controlled with Synthroid  75 mcg daily. No new symptoms related to her thyroid  condition are reported.  She experiences arthritis in her hands and is on meloxicam  15 mg daily for management. She is able to walk and engage in daily activities without significant pain.  She takes B12 supplements, which have helped with her neuropathy symptoms.  Her past medical history includes moderate mixed hyperlipidemia, which does not currently require statin therapy. She is mindful of her diet, incorporating oatmeal, fruits, nuts, avocado, and eggs, and has reduced her intake of bacon and other processed foods.  She mentions stress related to her husband's upcoming MRI for cancer evaluation, which affects her sleep and causes her to wake up with worry. She has a family history of stress-related health issues, as her mother experienced heart problems due to stress.      Past Medical History:  Diagnosis Date    Hypertension    Thyroid  condition     Medications: Outpatient Medications Prior to Visit  Medication Sig   atenolol  (TENORMIN ) 50 MG tablet Take 1 tablet (50 mg total) by mouth daily.   cholecalciferol (VITAMIN D3) 25 MCG (1000 UNIT) tablet Take 1,000 Units by mouth daily.   [facility] IRON  325 (65 Fe) MG tablet TAKE 325 MG BY MOUTH DAILY.   cyanocobalamin  (VITAMIN B12) 1000 MCG tablet Take 1,000 mcg by mouth daily.   levothyroxine  (SYNTHROID ) 75 MCG tablet Take 1 tablet (75 mcg total) by mouth daily.   meloxicam  (MOBIC ) 15 MG tablet TAKE 0.5 TABLETS (7.5 MG TOTAL) BY MOUTH DAILY AS NEEDED.   Multiple Vitamin (MULTIVITAMIN) tablet Take 1 tablet by mouth daily.   [DISCONTINUED] amLODipine  (NORVASC ) 5 MG tablet Take 1 tablet (5 mg total) by mouth daily.   [DISCONTINUED] chlorpheniramine-HYDROcodone (TUSSIONEX) 10-8 MG/5ML Take 5 mLs by mouth at bedtime as needed for cough.   No facility-administered medications prior to visit.    Review of Systems  Last metabolic panel Lab Results  Component Value Date   GLUCOSE 95 10/03/2023   NA 138 10/03/2023   K 4.5 10/03/2023   CL 102 10/03/2023   CO2 21 10/03/2023   BUN 11 10/03/2023   CREATININE 0.67 10/03/2023   EGFR 99 10/03/2023   CALCIUM 9.9 10/03/2023   PROT 7.5 08/30/2023   ALBUMIN 4.0 08/30/2023   LABGLOB 3.5 08/30/2023   AGRATIO 1.2 03/08/2022   BILITOT 0.4 08/30/2023   ALKPHOS 112 08/30/2023   AST 19 08/30/2023   ALT 16  08/30/2023        Objective    BP (!) 150/68 (Cuff Size: Normal)   Pulse 88   Resp 16   Ht 5' (1.524 m)   Wt 129 lb 1.6 oz (58.6 kg)   SpO2 97%   BMI 25.21 kg/m  BP Readings from Last 3 Encounters:  10/18/23 (!) 150/68  10/03/23 (!) 179/81  08/30/23 (!) 147/70   Wt Readings from Last 3 Encounters:  10/18/23 129 lb 1.6 oz (58.6 kg)  10/03/23 130 lb 11.2 oz (59.3 kg)  08/30/23 130 lb (59 kg)        Physical Exam  General: Alert, no acute distress Cardio: Normal S1 and S2, RRR, no r/m/g Pulm:  CTAB, normal work of breathing   No results found for any visits on 10/18/23.  Assessment & Plan     Problem List Items Addressed This Visit       Cardiovascular and Mediastinum   Essential hypertension - Primary   Chronic essential hypertension. Home blood pressure readings have been variable. Current medications: amlodipine  5 mg and atenolol  50 mg. In-office readings: 152/75, 148/64, 160/78. Positive lifestyle changes noted. Discussed increasing amlodipine  to 10 mg daily for better control. Explained potential side effect of leg swelling and advised monitoring. If swelling occurs, reduce amlodipine  back to 5 mg. If blood pressure remains elevated after one week on increased amlodipine , start olmesartan  5 mg. Emphasized importance of home monitoring and reporting readings under 100/60 or symptoms of dizziness and lightheadedness. - Increase amlodipine  to 10 mg daily - Monitor for leg swelling; if swelling occurs, reduce amlodipine  back to 5 mg - Start olmesartan  5 mg if blood pressure remains elevated after one week on increased amlodipine  - Continue atenolol  50 mg daily - Continue home blood pressure monitoring - Schedule follow-up in 3 weeks      Relevant Medications   olmesartan  (BENICAR ) 5 MG tablet   amLODipine  (NORVASC ) 5 MG tablet     Endocrine   Adult hypothyroidism   Chronic hypothyroidism, well-controlled on current medication. - Continue Synthroid  75 mcg daily        Nervous and Auditory   Neuropathy   Chronic neuropathy, reports improvement with B12 supplementation. - Continue B12 supplementation        Musculoskeletal and Integument   Arthritis of hand   Chronic arthritis of bilateral hands, managed with meloxicam . Reports improvement in symptoms. - Continue meloxicam  15 mg daily        Other   Moderate mixed hyperlipidemia not requiring statin therapy   Moderate mixed hyperlipidemia, not currently requiring statin therapy. - Continue current management  without statin      Relevant Medications   olmesartan  (BENICAR ) 5 MG tablet   amLODipine  (NORVASC ) 5 MG tablet       General Health Maintenance Significant lifestyle changes including diet modification and regular exercise. Weight decreased from 130 lbs to 129 lbs since last visit. - Continue current diet and exercise regimen   Return in about 3 weeks (around 11/08/2023) for HTN,BMP .      Rockie Agent, MD  [facility] [phone] (phone) [phone] (fax)  [facility]

## 2023-10-18 NOTE — Assessment & Plan Note (Signed)
Chronic hypothyroidism, well-controlled on current medication. - Continue Synthroid 75 mcg daily

## 2023-10-18 NOTE — Patient Instructions (Signed)
 VISIT SUMMARY:  Cynthia Blankenship, today we reviewed your hypertension, hypothyroidism, arthritis, neuropathy, and hyperlipidemia. We discussed your current medications, lifestyle changes, and made some adjustments to better manage your blood pressure. Your thyroid  condition remains well-controlled, and your arthritis and neuropathy symptoms have improved with your current treatments. We also noted your positive dietary changes and weight management.  YOUR PLAN:  -HYPERTENSION: Hypertension means high blood pressure. Your home readings have varied, and your in-office readings were also high.   We will increase your amlodipine  to 10 mg daily to help control your blood pressure better. Watch for leg swelling; if it occurs, reduce the dose back to 5 mg.   If your blood pressure is still high after one week on the increased dose, we will start you on olmesartan  5 mg. Continue taking atenolol  50 mg daily and monitor your blood pressure at home. Report any readings under 100/60 or symptoms like dizziness and lightheadedness.   -NEUROPATHY: Neuropathy means nerve damage, which can cause pain, numbness, or tingling. Your symptoms have improved with B12 supplementation. Continue taking your B12 supplements.   -GENERAL HEALTH MAINTENANCE: You have made significant lifestyle changes, including diet modification and regular exercise, which have positively impacted your health. Your weight has decreased from 130 lbs to 129 lbs since your last visit. Continue with your current diet and exercise regimen.  INSTRUCTIONS:  Please schedule a follow-up appointment in three weeks. Continue monitoring your blood pressure at home and report any readings under 100/60 or symptoms like dizziness and lightheadedness. If you experience leg swelling after increasing your amlodipine  dose, reduce it back to 5 mg and inform us .

## 2023-11-02 ENCOUNTER — Other Ambulatory Visit: Payer: Self-pay | Admitting: Family Medicine

## 2023-11-02 DIAGNOSIS — I10 Essential (primary) hypertension: Secondary | ICD-10-CM

## 2023-11-03 NOTE — Telephone Encounter (Signed)
 Requested Prescriptions  Pending Prescriptions Disp Refills   atenolol (TENORMIN) 50 MG tablet [Pharmacy Med Name: ATENOLOL 50 MG TABLET] 90 tablet 0    Sig: TAKE 1 TABLET BY MOUTH EVERY DAY     Cardiovascular: Beta Blockers 2 Failed - 11/03/2023  9:51 AM      Failed - Last BP in normal range    BP Readings from Last 1 Encounters:  10/18/23 (!) 150/68         Passed - Cr in normal range and within 360 days    Creatinine, Ser  Date Value Ref Range Status  10/03/2023 0.67 0.57 - 1.00 mg/dL Final         Passed - Last Heart Rate in normal range    Pulse Readings from Last 1 Encounters:  10/18/23 88         Passed - Valid encounter within last 6 months    Recent Outpatient Visits           1 month ago Essential hypertension   [facility] Simmons-Robinson, [place], MD   2 months ago Essential hypertension   [place] [facility] [place], [place], MD   8 months ago Annual physical exam   [facility] [name], PA-C   1 year ago Essential hypertension   [place] [facility] [name], PA-C   1 year ago Cold extremities    [facility] [name], PA-C       Future Appointments             In 5 days Simmons-Robinson, Tawanna Cooler, MD [facility]

## 2023-11-08 ENCOUNTER — Ambulatory Visit (INDEPENDENT_AMBULATORY_CARE_PROVIDER_SITE_OTHER): Payer: Medicaid Other | Admitting: Family Medicine

## 2023-11-08 ENCOUNTER — Encounter: Payer: Self-pay | Admitting: Family Medicine

## 2023-11-08 VITALS — BP 138/68 | HR 75 | Ht 61.0 in | Wt 125.0 lb

## 2023-11-08 DIAGNOSIS — D509 Iron deficiency anemia, unspecified: Secondary | ICD-10-CM

## 2023-11-08 DIAGNOSIS — I10 Essential (primary) hypertension: Secondary | ICD-10-CM | POA: Diagnosis not present

## 2023-11-08 DIAGNOSIS — E039 Hypothyroidism, unspecified: Secondary | ICD-10-CM

## 2023-11-08 MED ORDER — AMLODIPINE BESYLATE 10 MG PO TABS: 10.0000 mg | ORAL_TABLET | Freq: Every day | ORAL | 3 refills | Status: DC

## 2023-11-08 MED ORDER — FERROUS SULFATE 325 (65 FE) MG PO TABS: 325.0000 mg | ORAL_TABLET | Freq: Two times a day (BID) | ORAL | 1 refills | Status: AC

## 2023-11-08 NOTE — Assessment & Plan Note (Signed)
 Chronic  Refilled ferrous sulfate 325mg  BID

## 2023-11-08 NOTE — Progress Notes (Signed)
 Established patient visit   Patient: Cynthia Blankenship   DOB: [birth year]   64 y.o. Female  MRN: [id number] Visit Date: 11/08/2023  Today's healthcare provider: Ronnald Ramp, MD   Chief Complaint  Patient presents with   Hypertension    She has been checking her Bp at home, its been in the range of 127/72. She is taking it around lunch after she has had her lunch and medication by that time.    Subjective     HPI     Hypertension    Additional comments: She has been checking her Bp at home, its been in the range of 127/72. She is taking it around lunch after she has had her lunch and medication by that time.       Last edited by Thedora Hinders, CMA on 11/08/2023  1:16 PM.       Discussed the use of AI scribe software for clinical note transcription with the patient, who gave verbal consent to proceed.  History of Present Illness   Cynthia Blankenship is a 63 year old female with hypertension who presents for follow-up.  She has chronic hypertension, currently improved with a blood pressure reading of 138/68 mmHg. She is on a regimen of amlodipine 10 mg daily and atenolol 50 mg daily. Although olmesartan 5 mg was previously prescribed, she has not started it as her blood pressure is at goal with the current regimen.  She has lost four pounds, attributing this to dietary changes such as eating avocado, eggs, and oatmeal with cashews and honey. She enjoys walking regularly, which she believes has contributed to her improved blood pressure.  She experiences a cramp in her foot when wearing heels for church but has no other significant complaints.  She has a history of an abdominal hysterectomy and questions the need for future Pap smears, noting that her last Pap smear in 2022 was normal.       Pt is requesting refill for iron supplements today for hx of anemia  Past Medical History:  Diagnosis Date   Hypertension    Thyroid condition      Medications: Outpatient Medications Prior to Visit  Medication Sig   atenolol (TENORMIN) 50 MG tablet TAKE 1 TABLET BY MOUTH EVERY DAY   cholecalciferol (VITAMIN D3) 25 MCG (1000 UNIT) tablet Take 1,000 Units by mouth daily.   cyanocobalamin (VITAMIN B12) 1000 MCG tablet Take 1,000 mcg by mouth daily.   levothyroxine (SYNTHROID) 75 MCG tablet Take 1 tablet (75 mcg total) by mouth daily.   meloxicam (MOBIC) 15 MG tablet TAKE 0.5 TABLETS (7.5 MG TOTAL) BY MOUTH DAILY AS NEEDED.   Multiple Vitamin (MULTIVITAMIN) tablet Take 1 tablet by mouth daily.   [DISCONTINUED] amLODipine (NORVASC) 5 MG tablet Take 2 tablets (10 mg total) by mouth daily.   [DISCONTINUED] [facility] IRON 325 (65 Fe) MG tablet TAKE 325 MG BY MOUTH DAILY.   [DISCONTINUED] olmesartan (BENICAR) 5 MG tablet Take 1 tablet (5 mg total) by mouth daily. (Patient not taking: Reported on 11/08/2023)   No facility-administered medications prior to visit.    Review of Systems  Last CBC Lab Results  Component Value Date   WBC 6.1 03/04/2023   HGB 11.5 03/04/2023   HCT 36.0 03/04/2023   MCV 81 03/04/2023   MCH 25.8 (L) 03/04/2023   RDW 15.3 03/04/2023   PLT 393 03/04/2023   Last metabolic panel Lab Results  Component Value Date   GLUCOSE 95  10/03/2023   NA 138 10/03/2023   K 4.5 10/03/2023   CL 102 10/03/2023   CO2 21 10/03/2023   BUN 11 10/03/2023   CREATININE 0.67 10/03/2023   EGFR 99 10/03/2023   CALCIUM 9.9 10/03/2023   PROT 7.5 08/30/2023   ALBUMIN 4.0 08/30/2023   LABGLOB 3.5 08/30/2023   AGRATIO 1.2 03/08/2022   BILITOT 0.4 08/30/2023   ALKPHOS 112 08/30/2023   AST 19 08/30/2023   ALT 16 08/30/2023   Last lipids Lab Results  Component Value Date   CHOL 175 03/04/2023   HDL 51 03/04/2023   LDLCALC 111 (H) 03/04/2023   TRIG 68 03/04/2023   CHOLHDL 3.4 03/04/2023   Last hemoglobin A1c Lab Results  Component Value Date   HGBA1C 5.9 (H) 10/03/2023   Last thyroid functions Lab Results  Component  Value Date   TSH 1.950 10/03/2023   Last vitamin D Lab Results  Component Value Date   VD25OH 31.5 03/15/2022   Last vitamin B12 and Folate Lab Results  Component Value Date   VITAMINB12 211 (L) 10/03/2023        Objective    BP 138/68 (BP Location: Left Arm, Patient Position: Sitting, Cuff Size: Small)   Pulse 75   Ht 5\' 1"  (1.549 m)   Wt 125 lb (56.7 kg)   SpO2 100%   BMI 23.62 kg/m  BP Readings from Last 3 Encounters:  11/08/23 138/68  10/18/23 (!) 150/68  10/03/23 (!) 179/81   Wt Readings from Last 3 Encounters:  11/08/23 125 lb (56.7 kg)  10/18/23 129 lb 1.6 oz (58.6 kg)  10/03/23 130 lb 11.2 oz (59.3 kg)        Physical Exam  General: Alert, no acute distress Cardio: Normal S1 and S2, RRR, no r/m/g Pulm: CTAB, normal work of breathing EXTRM: no LE edema  No results found for any visits on 11/08/23.  Assessment & Plan     Problem List Items Addressed This Visit       Cardiovascular and Mediastinum   Essential hypertension - Primary    Chronic hypertension, well-controlled with amlodipine and atenolol. Blood pressure today is 138/68 mmHg, at goal. She has not started olmesartan due to concerns about side effects, which appears unnecessary given current control. Discussed the importance of monitoring blood pressure and potential need for olmesartan if blood pressure increases. - Continue atenolol 50 mg daily - Continue amlodipine 10 mg daily - Discontinue olmesartan - Change amlodipine prescription to 10 mg tablets - Monitor blood pressure and report if it exceeds 150/90 mmHg      Relevant Medications   amLODipine (NORVASC) 10 MG tablet     Endocrine   Adult hypothyroidism   On levothyroxine, current prescription adequate with refills available until December. Chronic, stable  - Continue levothyroxine as prescribed - Refill levothyroxine in March        Other   Iron deficiency anemia   Chronic  Refilled ferrous sulfate 325mg  BID        Relevant Medications   ferrous sulfate ([facility] IRON) 325 (65 FE) MG tablet    General Health Maintenance Maintaining a healthy lifestyle with regular walking and a balanced diet. Weight is stable with a recent loss of 4 pounds. No need for Pap smears post-hysterectomy with previous normal results. - Schedule physical exam in June - Continue current diet and exercise regimen       Return in about 4 months (around 03/07/2024) for CPE.  Ronnald Ramp, MD  [facility] [phone] (phone) [phone] (fax)  [facility]

## 2023-11-08 NOTE — Assessment & Plan Note (Signed)
  Chronic hypertension, well-controlled with amlodipine and atenolol. Blood pressure today is 138/68 mmHg, at goal. She has not started olmesartan due to concerns about side effects, which appears unnecessary given current control. Discussed the importance of monitoring blood pressure and potential need for olmesartan if blood pressure increases. - Continue atenolol 50 mg daily - Continue amlodipine 10 mg daily - Discontinue olmesartan - Change amlodipine prescription to 10 mg tablets - Monitor blood pressure and report if it exceeds 150/90 mmHg

## 2023-11-08 NOTE — Assessment & Plan Note (Signed)
 On levothyroxine, current prescription adequate with refills available until December. Chronic, stable  - Continue levothyroxine as prescribed - Refill levothyroxine in March

## 2024-01-30 ENCOUNTER — Other Ambulatory Visit: Payer: Self-pay | Admitting: Family Medicine

## 2024-01-30 DIAGNOSIS — I10 Essential (primary) hypertension: Secondary | ICD-10-CM

## 2024-03-03 ENCOUNTER — Other Ambulatory Visit: Payer: Self-pay | Admitting: Family Medicine

## 2024-03-03 DIAGNOSIS — M25649 Stiffness of unspecified hand, not elsewhere classified: Secondary | ICD-10-CM

## 2024-03-26 ENCOUNTER — Other Ambulatory Visit: Payer: Self-pay | Admitting: Family Medicine

## 2024-03-26 DIAGNOSIS — I10 Essential (primary) hypertension: Secondary | ICD-10-CM

## 2024-04-06 ENCOUNTER — Ambulatory Visit: Admitting: Family Medicine

## 2024-04-06 ENCOUNTER — Encounter: Payer: Self-pay | Admitting: Family Medicine

## 2024-04-06 VITALS — BP 138/66 | HR 68 | Ht 61.0 in | Wt 130.0 lb

## 2024-04-06 DIAGNOSIS — M25649 Stiffness of unspecified hand, not elsewhere classified: Secondary | ICD-10-CM | POA: Diagnosis not present

## 2024-04-06 DIAGNOSIS — I10 Essential (primary) hypertension: Secondary | ICD-10-CM

## 2024-04-06 MED ORDER — ATENOLOL 50 MG PO TABS: 50.0000 mg | ORAL_TABLET | Freq: Every day | ORAL | 3 refills | Status: DC

## 2024-04-06 NOTE — Progress Notes (Signed)
 Established patient visit   Patient: Cynthia Blankenship   DOB: 1961/03/03   63 y.o. Female  MRN: [id number] Visit Date: 04/06/2024  Today's healthcare provider: Rockie Agent, MD   Chief Complaint  Patient presents with   Hypertension   Subjective       Discussed the use of AI scribe software for clinical note transcription with the patient, who gave verbal consent to proceed.  History of Present Illness Cynthia Blankenship is a 63 year old female with chronic hypertension who presents for follow-up.  She has been monitoring her blood pressure at home with recent readings of 128/79 mmHg, 119/72 mmHg, 134/69 mmHg, and 125/66 mmHg. However, her blood pressure was borderline elevated at 140/66 mmHg during today's visit. She is currently taking amlodipine  10 mg daily, atenolol  50 mg daily, and olmesartan .  She is on Synthroid  for her thyroid  condition and takes vitamin B12 and vitamin D  supplements. She feels good overall, with no significant new symptoms, although she experiences occasional tingling, which she manages with vitamin B12 supplementation.  She has a history of constipation, which has improved with dietary changes, including regular oatmeal consumption. She notes that she is not walking as much as before, which has affected her weight slightly.  She has received two doses of the shingles vaccine, with the second dose administered [facility]  Her husband has been diagnosed with a condition that is in the early stages, and she is hopeful about his treatment outcome. This situation is on her mind but she is managing it.     Past Medical History:  Diagnosis Date   Hypertension    Thyroid  condition     Medications: Outpatient Medications Prior to Visit  Medication Sig   amLODipine  (NORVASC ) 10 MG tablet Take 1 tablet (10 mg total) by mouth daily.   cholecalciferol (VITAMIN D3) 25 MCG (1000 UNIT) tablet Take 1,000 Units by mouth  daily.   cyanocobalamin  (VITAMIN B12) 1000 MCG tablet Take 1,000 mcg by mouth daily.   ferrous sulfate  ([facility] IRON ) 325 (65 FE) MG tablet Take 1 tablet (325 mg total) by mouth 2 (two) times daily with a meal.   ferrous sulfate  325 (65 FE) MG tablet Take 325 mg by mouth 2 (two) times daily with a meal.   levothyroxine  (SYNTHROID ) 75 MCG tablet Take 1 tablet (75 mcg total) by mouth daily.   meloxicam  (MOBIC ) 15 MG tablet TAKE 0.5 TABLETS (7.5 MG TOTAL) BY MOUTH DAILY AS NEEDED.   Multiple Vitamin (MULTIVITAMIN) tablet Take 1 tablet by mouth daily.   [DISCONTINUED] atenolol  (TENORMIN ) 50 MG tablet TAKE 1 TABLET BY MOUTH EVERY DAY   [DISCONTINUED] olmesartan  (BENICAR ) 5 MG tablet Take 5 mg by mouth daily.   No facility-administered medications prior to visit.    Review of Systems  Last metabolic panel Lab Results  Component Value Date   GLUCOSE 95 10/03/2023   NA 138 10/03/2023   K 4.5 10/03/2023   CL 102 10/03/2023   CO2 21 10/03/2023   BUN 11 10/03/2023   CREATININE 0.67 10/03/2023   EGFR 99 10/03/2023   CALCIUM 9.9 10/03/2023   PROT 7.5 08/30/2023   ALBUMIN 4.0 08/30/2023   LABGLOB 3.5 08/30/2023   AGRATIO 1.2 03/08/2022   BILITOT 0.4 08/30/2023   ALKPHOS 112 08/30/2023   AST 19 08/30/2023   ALT 16 08/30/2023     Lab Results  Component Value Date   TSH 1.950 10/03/2023  Objective    BP 138/66 (BP Location: Left Arm, Patient Position: Sitting, Cuff Size: Normal)   Pulse 68   Ht 5' 1 (1.549 m)   Wt 130 lb (59 kg)   SpO2 100%   BMI 24.56 kg/m  BP Readings from Last 3 Encounters:  04/06/24 138/66  11/08/23 138/68  10/18/23 (!) 150/68   Wt Readings from Last 3 Encounters:  04/06/24 130 lb (59 kg)  11/08/23 125 lb (56.7 kg)  10/18/23 129 lb 1.6 oz (58.6 kg)        Physical Exam Vitals reviewed.  Constitutional:      General: She is not in acute distress.    Appearance: Normal appearance. She is not ill-appearing.  Cardiovascular:     Rate and  Rhythm: Normal rate and regular rhythm.  Pulmonary:     Effort: Pulmonary effort is normal. No respiratory distress.     Breath sounds: No wheezing, rhonchi or rales.  Neurological:     Mental Status: She is alert and oriented to person, place, and time.  Psychiatric:        Mood and Affect: Mood normal.        Behavior: Behavior normal.       No results found for any visits on 04/06/24.  Assessment & Plan     Problem List Items Addressed This Visit       Cardiovascular and Mediastinum   Essential hypertension - Primary   Chronic hypertension with borderline elevated blood pressure today at 140/66 mmHg. Home blood pressure readings are within normal range, indicating good control with current medication regimen. She is on amlodipine  10 mg, atenolol  50 mg, and olmesartan , but olmesartan  is deemed unnecessary given current blood pressure control. She prefers to maintain her current regimen and monitor her blood pressure at home. - Discontinue olmesartan  - Continue amlodipine  10 mg daily - Continue atenolol  50 mg daily - Monitor blood pressure at home regularly      Relevant Medications   atenolol  (TENORMIN ) 50 MG tablet     Other   Stiffness of hand joint     Assessment & Plan   Hypothyroidism Well-managed on current thyroid  medication. No issues reported with thyroid  management. Thyroid  function will be re-evaluated in January. - Continue current thyroid  medication - Re-evaluate thyroid  function in January  General Health Maintenance She has completed the shingles vaccination series and is due for a COVID booster. HIV screening was discussed as part of routine health maintenance. She plans to receive the COVID booster when the cold weather arrives. - Schedule physical examination in November - Administer COVID booster when available - Document completion of shingles vaccination series - Offer HIV screening as part of routine health maintenance     Return in about  4 months (around 08/07/2024) for CPE, A1c, CMP, Vitamin B12 & D.         Rockie Agent, MD  [facility] [phone] (phone) [phone] (fax)  [facility]

## 2024-04-06 NOTE — Assessment & Plan Note (Signed)
 Chronic hypertension with borderline elevated blood pressure today at 140/66 mmHg. Home blood pressure readings are within normal range, indicating good control with current medication regimen. She is on amlodipine  10 mg, atenolol  50 mg, and olmesartan , but olmesartan  is deemed unnecessary given current blood pressure control. She prefers to maintain her current regimen and monitor her blood pressure at home. - Discontinue olmesartan  - Continue amlodipine  10 mg daily - Continue atenolol  50 mg daily - Monitor blood pressure at home regularly

## 2024-04-28 ENCOUNTER — Other Ambulatory Visit: Payer: Self-pay | Admitting: Family Medicine

## 2024-04-28 DIAGNOSIS — I10 Essential (primary) hypertension: Secondary | ICD-10-CM

## 2024-05-01 ENCOUNTER — Telehealth: Payer: Self-pay | Admitting: Family Medicine

## 2024-05-01 DIAGNOSIS — M25649 Stiffness of unspecified hand, not elsewhere classified: Secondary | ICD-10-CM

## 2024-05-01 NOTE — Telephone Encounter (Unsigned)
 Copied from CRM #[id number]. Topic: Clinical - Medication Refill >> May 01, 2024 10:13 AM Fonda T wrote: Medication: meloxicam  (MOBIC ) 15 MG tablet  Has the patient contacted their pharmacy? Yes, pharmacy is calling on behalf of patient, Huong with pharmacy calling   This is the patient's preferred pharmacy:  [facility] [address] [place] - [address] [place] [address] Phone: [phone] Fax: [phone]   Is this the correct pharmacy for this prescription? Yes If no, delete pharmacy and type the correct one.   Has the prescription been filled recently? Yes  Is the patient out of the medication? Yes  Has the patient been seen for an appointment in the last year OR does the patient have an upcoming appointment? Yes  Can we respond through MyChart? No  Agent: Please be advised that Rx refills may take up to 3 business days. We ask that you follow-up with your pharmacy.

## 2024-05-02 NOTE — Telephone Encounter (Signed)
 Requested medication (s) are due for refill today: yes   Requested medication (s) are on the active medication list: yes   Last refill:  09/15/23 #45 1 refills  Future visit scheduled: yes in 3 months  Notes to clinic:  manual review of labs required. Do you want to refill Rx?     Requested Prescriptions  Pending Prescriptions Disp Refills   meloxicam  (MOBIC ) 15 MG tablet 45 tablet 1    Sig: Take 0.5 tablets (7.5 mg total) by mouth daily as needed.     Analgesics:  COX2 Inhibitors Failed - 05/02/2024  4:10 PM      Failed - Manual Review: Labs are only required if the patient has taken medication for more than 8 weeks.      Failed - HGB in normal range and within 360 days    Hemoglobin  Date Value Ref Range Status  03/04/2023 11.5 11.1 - 15.9 g/dL Final         Failed - HCT in normal range and within 360 days    Hematocrit  Date Value Ref Range Status  03/04/2023 36.0 34.0 - 46.6 % Final         Passed - Cr in normal range and within 360 days    Creatinine, Ser  Date Value Ref Range Status  10/03/2023 0.67 0.57 - 1.00 mg/dL Final         Passed - AST in normal range and within 360 days    AST  Date Value Ref Range Status  08/30/2023 19 0 - 40 IU/L Final         Passed - ALT in normal range and within 360 days    ALT  Date Value Ref Range Status  08/30/2023 16 0 - 32 IU/L Final         Passed - eGFR is 30 or above and within 360 days    GFR calc Af Amer  Date Value Ref Range Status  04/22/2020 108 >59 mL/min/1.73 Final    Comment:    **[facility] currently reports eGFR in compliance with the current**   recommendations of the [employer]. [facility] will   update reporting as new guidelines are published from the NKF-ASN   Task force.    GFR calc non Af Amer  Date Value Ref Range Status  04/22/2020 94 >59 mL/min/1.73 Final   eGFR  Date Value Ref Range Status  10/03/2023 99 >59 mL/min/1.73 Final         Passed - Patient is not pregnant       Passed - Valid encounter within last 12 months    Recent Outpatient Visits           3 weeks ago Essential hypertension   [facility] Simmons-Robinson, [place], MD   5 months ago Essential hypertension   [place] [facility] Simmons-Robinson, [place], MD   6 months ago Essential hypertension   [place] [facility] Simmons-Robinson, Rockie, MD       Future Appointments             In 3 months Simmons-Robinson, Rockie, MD [facility]

## 2024-05-03 MED ORDER — MELOXICAM 15 MG PO TABS: 7.5000 mg | ORAL_TABLET | Freq: Every day | ORAL | 1 refills | Status: AC | PRN

## 2024-05-10 ENCOUNTER — Ambulatory Visit: Admitting: Family Medicine

## 2024-06-11 ENCOUNTER — Ambulatory Visit: Payer: Self-pay

## 2024-06-11 NOTE — Telephone Encounter (Signed)
 FYI Only or Action Required?: FYI only for provider. Scheduled for 10/1  Patient was last seen in primary care on 04/06/2024 by Sharma Coyer, MD.  Called Nurse Triage reporting Urinary Frequency.  Symptoms began a week ago.  Interventions attempted: Nothing.  Symptoms are: gradually worsening.  Triage Disposition: See Physician Within 24 Hours  Patient/caregiver understands and will follow disposition?: Yes  Reason for Disposition  Urinating more frequently than usual (i.e., frequency) OR new-onset of the feeling of an urgent need to urinate (i.e., urgency)  Answer Assessment - Initial Assessment Questions Symptoms started last week some time and thought the frequency would decrease. Knows urinary frequency is a potential side effect of amlodipine . Has been on amlodipine  since June 2024, and increased early Feb 2025.  No hx of frequent UTIs  1. SYMPTOM: What's the main symptom you're concerned about? (e.g., frequency, incontinence)    More frequent urination, uncomfortable bladder 2. ONSET: When did the  frequency  start?     Last week 3. PAIN: Is there any pain? If Yes, ask: How bad is it? (Scale: 1-10; mild, moderate, severe)     1-2/10- uncomfortable when walking  4. CAUSE: What do you think is causing the symptoms?     Amlodipine  side effect 5. OTHER SYMPTOMS: Do you have any other symptoms? (e.g., blood in urine, fever, flank pain, pain with urination)   Uncomfortable feeling in bladder  Protocols used: Urinary Symptoms-A-AH

## 2024-06-11 NOTE — Telephone Encounter (Signed)
 This RN attempted to call patient, no answer. Left voicemail with call back number.     Copied from CRM #[id number]. Topic: Clinical - Medication Question >> Jun 11, 2024  4:09 PM Amy B wrote: Reason for CRM: Patient wants to know if her amlodipine  dosage can be reduced or perhaps switched to something else.  She states she is having increased urination and frequency.

## 2024-06-12 NOTE — Telephone Encounter (Signed)
 Patient call note and symptoms reviewed. Agree with scheduled appt. Will evaluate during OV

## 2024-06-13 ENCOUNTER — Encounter: Payer: Self-pay | Admitting: Family Medicine

## 2024-06-13 ENCOUNTER — Ambulatory Visit: Admitting: Family Medicine

## 2024-06-13 VITALS — BP 136/66 | HR 63 | Ht 61.0 in | Wt 130.4 lb

## 2024-06-13 DIAGNOSIS — D508 Other iron deficiency anemias: Secondary | ICD-10-CM | POA: Diagnosis not present

## 2024-06-13 DIAGNOSIS — I10 Essential (primary) hypertension: Secondary | ICD-10-CM | POA: Diagnosis not present

## 2024-06-13 DIAGNOSIS — R350 Frequency of micturition: Secondary | ICD-10-CM | POA: Diagnosis not present

## 2024-06-13 LAB — POCT URINALYSIS DIPSTICK
Bilirubin, UA: NEGATIVE
Blood, UA: NEGATIVE
Glucose, UA: NEGATIVE
Ketones, UA: NEGATIVE
Leukocytes, UA: NEGATIVE
Nitrite, UA: NEGATIVE
Odor: ABNORMAL
Protein, UA: POSITIVE — AB
Spec Grav, UA: 1.015 (ref 1.010–1.025)
Urobilinogen, UA: 1 U/dL
pH, UA: 6 (ref 5.0–8.0)

## 2024-06-13 NOTE — Progress Notes (Signed)
 ACUTE VISIT   Patient: Cynthia Blankenship   DOB: 05/14/1961   63 y.o. Female  MRN: [id number]   PCP: Sharma Coyer, MD  Chief Complaint  Patient presents with   Urinary Frequency    Patient is present with urinary frequency and bladder discomfort x 7 days. Patient notes some improvement in symptoms today, has not been urinating as much.    Subjective    HPI HPI     Urinary Frequency    Additional comments: Patient is present with urinary frequency and bladder discomfort x 7 days. Patient notes some improvement in symptoms today, has not been urinating as much.       Last edited by Cherry Chiquita HERO, CMA on 06/13/2024 10:09 AM.       Discussed the use of AI scribe software for clinical note transcription with the patient, who gave verbal consent to proceed.  History of Present Illness Cynthia Blankenship is a 63 year old female with hypertension who presents with urinary frequency.  She has been experiencing urinary frequency for the past seven days, with variable urine output. There is no associated pain, discomfort, or unusual smell. Her urination pattern was less frequent yesterday. She drinks water regularly.  She reports a sensation of pressure on the left side but denies significant pain. She has a history of left-sided sciatica, which has resolved, but occasionally experiences soreness in her left leg and hip area, possibly due to arthritic changes. This soreness is not painful.  There is no urinary urgency preventing her from reaching the bathroom in time, although she experiences stress incontinence when sneezing. No vaginal discharge or bleeding is present. A recent urinalysis showed protein but no signs of a urinary tract infection.  She maintains a regular bowel movement pattern, having a bowel movement every day without diarrhea or abnormal stool color, which she attributes to her daily oatmeal consumption.  She is currently taking amlodipine  and has  noted that it may cause increased urination. Her blood pressure readings at home have been 119/66 and 116/69.   Pt would like to know if she can take her iron  pills after her blood pressure medication  Medications: Outpatient Medications Prior to Visit  Medication Sig   amLODipine  (NORVASC ) 10 MG tablet Take 1 tablet (10 mg total) by mouth daily.   atenolol  (TENORMIN ) 50 MG tablet TAKE 1 TABLET BY MOUTH EVERY DAY   cholecalciferol (VITAMIN D3) 25 MCG (1000 UNIT) tablet Take 1,000 Units by mouth daily.   cyanocobalamin  (VITAMIN B12) 1000 MCG tablet Take 1,000 mcg by mouth daily.   ferrous sulfate  ([employer] IRON ) 325 (65 FE) MG tablet Take 1 tablet (325 mg total) by mouth 2 (two) times daily with a meal.   ferrous sulfate  325 (65 FE) MG tablet Take 325 mg by mouth 2 (two) times daily with a meal.   levothyroxine  (SYNTHROID ) 75 MCG tablet Take 1 tablet (75 mcg total) by mouth daily.   meloxicam  (MOBIC ) 15 MG tablet Take 0.5 tablets (7.5 mg total) by mouth daily as needed.   Multiple Vitamin (MULTIVITAMIN) tablet Take 1 tablet by mouth daily.   No facility-administered medications prior to visit.    Last CBC Lab Results  Component Value Date   WBC 6.1 03/04/2023   HGB 11.5 03/04/2023   HCT 36.0 03/04/2023   MCV 81 03/04/2023   MCH 25.8 (L) 03/04/2023   RDW 15.3 03/04/2023   PLT 393 03/04/2023   Last metabolic panel Lab Results  Component Value Date   GLUCOSE 95 10/03/2023   NA 138 10/03/2023   K 4.5 10/03/2023   CL 102 10/03/2023   CO2 21 10/03/2023   BUN 11 10/03/2023   CREATININE 0.67 10/03/2023   EGFR 99 10/03/2023   CALCIUM 9.9 10/03/2023   PROT 7.5 08/30/2023   ALBUMIN 4.0 08/30/2023   LABGLOB 3.5 08/30/2023   AGRATIO 1.2 03/08/2022   BILITOT 0.4 08/30/2023   ALKPHOS 112 08/30/2023   AST 19 08/30/2023   ALT 16 08/30/2023   Last lipids Lab Results  Component Value Date   CHOL 175 03/04/2023   HDL 51 03/04/2023   LDLCALC 111 (H) 03/04/2023   TRIG 68 03/04/2023    CHOLHDL 3.4 03/04/2023   Last hemoglobin A1c Lab Results  Component Value Date   HGBA1C 5.9 (H) 10/03/2023   Last thyroid  functions Lab Results  Component Value Date   TSH 1.950 10/03/2023   Last vitamin D  Lab Results  Component Value Date   VD25OH 31.5 03/15/2022   Last vitamin B12 and Folate Lab Results  Component Value Date   VITAMINB12 211 (L) 10/03/2023        Objective    BP 136/66 (Cuff Size: Normal)   Pulse 63   Ht 5' 1 (1.549 m)   Wt 130 lb 6.4 oz (59.1 kg)   SpO2 100%   BMI 24.64 kg/m  BP Readings from Last 3 Encounters:  06/13/24 136/66  04/06/24 138/66  11/08/23 138/68   Wt Readings from Last 3 Encounters:  06/13/24 130 lb 6.4 oz (59.1 kg)  04/06/24 130 lb (59 kg)  11/08/23 125 lb (56.7 kg)      Physical Exam   Physical Exam GENERAL: Alert, well developed, well nourished, no acute distress. CARDIOVASCULAR: Regular rate and rhythm. ABDOMEN: Non-tender, no suprapubic tenderness, no CVA tenderness, non-distended. EXTREMITIES: No edema in lower extremities.   Results for orders placed or performed in visit on 06/13/24  POCT Urinalysis Dipstick  Result Value Ref Range   Color, UA yellow    Clarity, UA clear    Glucose, UA Negative Negative   Bilirubin, UA negative    Ketones, UA negative    Spec Grav, UA 1.015 1.010 - 1.025   Blood, UA negative    pH, UA 6.0 5.0 - 8.0   Protein, UA Positive (A) Negative   Urobilinogen, UA 1.0 0.2 or 1.0 E.U./dL   Nitrite, UA negative    Leukocytes, UA Negative Negative   Appearance clear    Odor abnormal     Assessment & Plan      Assessment & Plan Urinary frequency Urinary frequency for seven days without dysuria, hematuria, or suprapubic pain. Urinalysis showed proteinuria but no signs of UTI. Possible overactive bladder considered. Nocturia occurs once or twice per night. No significant urgency or incontinence, except occasional stress incontinence with sneezing. No CVA tenderness or abdominal  tenderness. Differential includes overactive bladder and possible stress-related changes. Reports some left-sided pressure, possibly related to previous sciatica or hip joint issues. Symptoms may be influenced by stress related to upcoming travel. - Send urine sample for culture to rule out infection. - Consider treatment for overactive bladder if symptoms persist or worsen, noting potential side effects such as dry mouth, dry eyes, and constipation. - Advise to maintain hydration and regular physical activity.     Return in about 7 weeks (around 08/01/2024).        Rockie Agent, MD  [facility] [phone] (phone)  [phone] (fax)  [facility]

## 2024-06-13 NOTE — Assessment & Plan Note (Signed)
 Chronic  Blood pressure initially elevated but within normal range on repeat  well-controlled hypertension. - continue amlodipine  10mg  daily and atenolol  50mg  daily

## 2024-06-13 NOTE — Patient Instructions (Signed)
 To keep you healthy, please keep in mind the following health maintenance items that you are due for:   Health Maintenance Due  Topic Date Due   Pneumococcal Vaccine: 50+ Years (1 of 1 - PCV) Never done   Zoster Vaccines- Shingrix (2 of 2) 10/14/2021     Best Wishes,   Dr. Lang

## 2024-06-18 LAB — URINE CULTURE

## 2024-06-18 LAB — SPECIMEN STATUS REPORT

## 2024-06-19 ENCOUNTER — Ambulatory Visit: Payer: Self-pay | Admitting: Family Medicine

## 2024-06-19 DIAGNOSIS — N390 Urinary tract infection, site not specified: Secondary | ICD-10-CM

## 2024-06-19 MED ORDER — SULFAMETHOXAZOLE-TRIMETHOPRIM 800-160 MG PO TABS: 1.0000 | ORAL_TABLET | Freq: Two times a day (BID) | ORAL | 0 refills | Status: AC

## 2024-06-19 NOTE — Telephone Encounter (Signed)
 FYI........   Copied from CRM [id number]. Topic: Clinical - Lab/Test Results >> Jun 19, 2024 10:41 AM Sophia H wrote: Reason for CRM: Patient returning call regarding urine culture, advised per chart note :Urine culture was positive for E coli UTI    I recommend that you be treated with an antibiotic given the symptoms you have been experiencing    Please take Bactrim  twice daily for 7 days to treat the UTI   bactrim  was sent to [facility] [address]. Patient verbalized understanding but states she will not be able to start the medication up till Friday - she is out of town right now.

## 2024-08-01 ENCOUNTER — Ambulatory Visit (INDEPENDENT_AMBULATORY_CARE_PROVIDER_SITE_OTHER): Admitting: Family Medicine

## 2024-08-01 ENCOUNTER — Encounter: Payer: Self-pay | Admitting: Family Medicine

## 2024-08-01 VITALS — BP 134/68 | HR 62 | Temp 98.6°F | Ht 60.5 in | Wt 128.7 lb

## 2024-08-01 DIAGNOSIS — D508 Other iron deficiency anemias: Secondary | ICD-10-CM

## 2024-08-01 DIAGNOSIS — E559 Vitamin D deficiency, unspecified: Secondary | ICD-10-CM | POA: Diagnosis not present

## 2024-08-01 DIAGNOSIS — E782 Mixed hyperlipidemia: Secondary | ICD-10-CM

## 2024-08-01 DIAGNOSIS — Z23 Encounter for immunization: Secondary | ICD-10-CM | POA: Diagnosis not present

## 2024-08-01 DIAGNOSIS — E039 Hypothyroidism, unspecified: Secondary | ICD-10-CM

## 2024-08-01 DIAGNOSIS — I10 Essential (primary) hypertension: Secondary | ICD-10-CM

## 2024-08-01 DIAGNOSIS — E049 Nontoxic goiter, unspecified: Secondary | ICD-10-CM | POA: Diagnosis not present

## 2024-08-01 DIAGNOSIS — Z131 Encounter for screening for diabetes mellitus: Secondary | ICD-10-CM

## 2024-08-01 DIAGNOSIS — J309 Allergic rhinitis, unspecified: Secondary | ICD-10-CM | POA: Diagnosis not present

## 2024-08-01 DIAGNOSIS — Z0000 Encounter for general adult medical examination without abnormal findings: Secondary | ICD-10-CM

## 2024-08-01 DIAGNOSIS — G629 Polyneuropathy, unspecified: Secondary | ICD-10-CM

## 2024-08-01 DIAGNOSIS — E538 Deficiency of other specified B group vitamins: Secondary | ICD-10-CM

## 2024-08-01 DIAGNOSIS — Z1231 Encounter for screening mammogram for malignant neoplasm of breast: Secondary | ICD-10-CM

## 2024-08-01 MED ORDER — AMLODIPINE BESYLATE 10 MG PO TABS: 10.0000 mg | ORAL_TABLET | Freq: Every day | ORAL | 3 refills | Status: AC

## 2024-08-01 MED ORDER — ATENOLOL 50 MG PO TABS: 50.0000 mg | ORAL_TABLET | Freq: Every day | ORAL | 3 refills | Status: AC

## 2024-08-01 MED ORDER — LEVOTHYROXINE SODIUM 75 MCG PO TABS: 75.0000 ug | ORAL_TABLET | Freq: Every day | ORAL | 3 refills | Status: AC

## 2024-08-01 NOTE — Patient Instructions (Addendum)
 It was a pleasure to see you today!  Thank you for choosing [facility] for your primary care.   Today you were seen for your annual physical  Please review the attached information regarding helpful preventive health topics.   To keep you healthy, please keep in mind the following health maintenance items that you are due for:   Health Maintenance Due  Topic Date Due   Pneumococcal Vaccine: 50+ Years (1 of 1 - PCV) Never done   Mammogram  08/14/2024     Best Wishes,   Dr. Lang

## 2024-08-01 NOTE — Progress Notes (Signed)
 Complete physical exam   Patient: Cynthia Blankenship   DOB: 1961-07-23   63 y.o. Female  MRN: [id number] Visit Date: 08/01/2024  Today's healthcare provider: Rockie Agent, MD   Chief Complaint  Patient presents with   Annual Exam    Patient is present for annual exam with PCP.  Diet is normal well balanced. Not currently exercising due to colder weather  Vaccines: Prevnar- will do   Screenings: Mammogram- will do   Subjective    Cynthia Blankenship is a 63 y.o. female who presents today for a complete physical exam.    She does have additional problems to discuss today.   Discussed the use of AI scribe software for clinical note transcription with the patient, who gave verbal consent to proceed.  History of Present Illness Cynthia Blankenship is a 63 year old female who presents for an annual physical exam.  She feels good overall and has been monitoring her blood pressure at home, noting readings similar to today's measurement of 134/68 mmHg. She has stopped drinking sodas, opting for green tea and water instead, which she feels has helped maintain her weight around 125-128 pounds.  Her medical history includes hypothyroidism, for which she takes Synthroid  75 micrograms daily. She also has hypertension, managed with amlodipine  10 mg and atenolol  50 mg daily. She has arthritis and experiences some aches and pains, particularly in her shoulder, which she attributes to weather changes. She uses meloxicam  as needed for pain relief and has been doing resistance training and stretching exercises to maintain mobility.  She has a history of vitamin D  deficiency and iron  deficiency anemia. She is also on prednisone. She recently completed treatment for a urinary tract infection, which caused constipation during the course of medication. Her bowel movements have since returned to normal.  Her social history includes attending her grandson's school events and being cautious about  exposure to illnesses, especially in crowded places. She has received her flu shot. Her blood pressure readings at home are consistent with today's measurement.     Past Medical History:  Diagnosis Date   Hypertension    Thyroid  condition    Past Surgical History:  Procedure Laterality Date   ABDOMINAL HYSTERECTOMY     COLONOSCOPY N/A 09/09/2021   Procedure: COLONOSCOPY;  Surgeon: Janalyn Keene NOVAK, MD;  Location: [facility] ENDOSCOPY;  Service: Endoscopy;  Laterality: N/A;   TUBAL LIGATION     Social History   Socioeconomic History   Marital status: Married    Spouse name: Not on file   Number of children: Not on file   Years of education: Not on file   Highest education level: Not on file  Occupational History   Not on file  Tobacco Use   Smoking status: Former   Smokeless tobacco: Never  Vaping Use   Vaping status: Never Used  Substance and Sexual Activity   Alcohol use: No    Alcohol/week: 0.0 standard drinks of alcohol   Drug use: No   Sexual activity: Yes  Other Topics Concern   Not on file  Social History Narrative   Not on file   Social Drivers of Health   Financial Resource Strain: Low Risk  (11/08/2023)   Overall Financial Resource Strain (CARDIA)    Difficulty of Paying Living Expenses: Not hard at all  Food Insecurity: No Food Insecurity (11/08/2023)   Hunger Vital Sign    Worried About Running Out of Food in the Last Year: Never true  Ran Out of Food in the Last Year: Never true  Transportation Needs: No Transportation Needs (11/08/2023)   PRAPARE - Administrator, Civil Service (Medical): No    Lack of Transportation (Non-Medical): No  Physical Activity: Insufficiently Active (11/08/2023)   Exercise Vital Sign    Days of Exercise per Week: 2 days    Minutes of Exercise per Session: 30 min  Stress: No Stress Concern Present (11/08/2023)   [employer] of Occupational Health - Occupational Stress Questionnaire    Feeling of Stress :  Only a little  Social Connections: Not on file  Intimate Partner Violence: Not At Risk (11/08/2023)   Humiliation, Afraid, Rape, and Kick questionnaire    Fear of Current or Ex-Partner: No    Emotionally Abused: No    Physically Abused: No    Sexually Abused: No   Family Status  Relation Name Status   Mother  Deceased   Father  Deceased   Sister  Alive   Sister  Alive   Sister  Deceased   Brother  Alive   Brother  Alive   Neg Hx  (Not Specified)  No partnership data on file   Family History  Problem Relation Age of Onset   Hypertension Mother    Alcohol abuse Father    Cirrhosis Father    Hypertension Sister    Hypertension Sister    Hypertension Sister    Kidney failure Sister    Hypertension Brother    Hypertension Brother    Breast cancer Neg Hx    No Known Allergies   Medications: Outpatient Medications Prior to Visit  Medication Sig   cholecalciferol (VITAMIN D3) 25 MCG (1000 UNIT) tablet Take 1,000 Units by mouth daily.   cyanocobalamin  (VITAMIN B12) 1000 MCG tablet Take 1,000 mcg by mouth daily.   ferrous sulfate  (CVS IRON ) 325 (65 FE) MG tablet Take 1 tablet (325 mg total) by mouth 2 (two) times daily with a meal.   ferrous sulfate  325 (65 FE) MG tablet Take 325 mg by mouth 2 (two) times daily with a meal.   meloxicam  (MOBIC ) 15 MG tablet Take 0.5 tablets (7.5 mg total) by mouth daily as needed.   Multiple Vitamin (MULTIVITAMIN) tablet Take 1 tablet by mouth daily.   [DISCONTINUED] amLODipine  (NORVASC ) 10 MG tablet Take 1 tablet (10 mg total) by mouth daily.   [DISCONTINUED] atenolol  (TENORMIN ) 50 MG tablet TAKE 1 TABLET BY MOUTH EVERY DAY   [DISCONTINUED] levothyroxine  (SYNTHROID ) 75 MCG tablet Take 1 tablet (75 mcg total) by mouth daily.   No facility-administered medications prior to visit.    Review of Systems  Last CBC Lab Results  Component Value Date   WBC 6.1 03/04/2023   HGB 11.5 03/04/2023   HCT 36.0 03/04/2023   MCV 81 03/04/2023   MCH  25.8 (L) 03/04/2023   RDW 15.3 03/04/2023   PLT 393 03/04/2023   Last metabolic panel Lab Results  Component Value Date   GLUCOSE 95 10/03/2023   NA 138 10/03/2023   K 4.5 10/03/2023   CL 102 10/03/2023   CO2 21 10/03/2023   BUN 11 10/03/2023   CREATININE 0.67 10/03/2023   EGFR 99 10/03/2023   CALCIUM 9.9 10/03/2023   PROT 7.5 08/30/2023   ALBUMIN 4.0 08/30/2023   LABGLOB 3.5 08/30/2023   AGRATIO 1.2 03/08/2022   BILITOT 0.4 08/30/2023   ALKPHOS 112 08/30/2023   AST 19 08/30/2023   ALT 16 08/30/2023   Last lipids Lab  Results  Component Value Date   CHOL 175 03/04/2023   HDL 51 03/04/2023   LDLCALC 111 (H) 03/04/2023   TRIG 68 03/04/2023   CHOLHDL 3.4 03/04/2023   Last hemoglobin A1c Lab Results  Component Value Date   HGBA1C 5.9 (H) 10/03/2023   Last thyroid  functions Lab Results  Component Value Date   TSH 1.950 10/03/2023   FREET4 1.75 10/03/2023   Last vitamin D  Lab Results  Component Value Date   VD25OH 31.5 03/15/2022   Last vitamin B12 and Folate Lab Results  Component Value Date   VITAMINB12 211 (L) 10/03/2023       Objective    BP 134/68 (Cuff Size: Normal)   Pulse 62   Temp 98.6 F (37 C) (Oral)   Ht 5' 0.5 (1.537 m)   Wt 128 lb 11.2 oz (58.4 kg)   SpO2 100%   BMI 24.72 kg/m  BP Readings from Last 3 Encounters:  08/01/24 134/68  06/13/24 136/66  04/06/24 138/66   Wt Readings from Last 3 Encounters:  08/01/24 128 lb 11.2 oz (58.4 kg)  06/13/24 130 lb 6.4 oz (59.1 kg)  04/06/24 130 lb (59 kg)        Physical Exam Vitals reviewed.  Constitutional:      General: She is not in acute distress.    Appearance: Normal appearance. She is not ill-appearing, toxic-appearing or diaphoretic.  HENT:     Head: Normocephalic and atraumatic.     Right Ear: Tympanic membrane and external ear normal. There is no impacted cerumen.     Left Ear: Tympanic membrane and external ear normal. There is no impacted cerumen.     Nose: Nose  normal.     Mouth/Throat:     Pharynx: Oropharynx is clear.  Eyes:     General: No scleral icterus.    Extraocular Movements: Extraocular movements intact.     Conjunctiva/sclera: Conjunctivae normal.     Pupils: Pupils are equal, round, and reactive to light.  Cardiovascular:     Rate and Rhythm: Normal rate and regular rhythm.     Pulses: Normal pulses.     Heart sounds: Normal heart sounds. No murmur heard.    No friction rub. No gallop.  Pulmonary:     Effort: Pulmonary effort is normal. No respiratory distress.     Breath sounds: Normal breath sounds. No wheezing, rhonchi or rales.  Abdominal:     General: Bowel sounds are normal. There is no distension.     Palpations: Abdomen is soft. There is no mass.     Tenderness: There is no abdominal tenderness. There is no guarding.  Musculoskeletal:        General: No deformity.     Cervical back: Normal range of motion and neck supple.     Right lower leg: No edema.     Left lower leg: No edema.  Lymphadenopathy:     Cervical: No cervical adenopathy.  Skin:    General: Skin is warm.     Capillary Refill: Capillary refill takes less than 2 seconds.     Findings: No erythema or rash.  Neurological:     General: No focal deficit present.     Mental Status: She is alert and oriented to person, place, and time.     Cranial Nerves: Cranial nerves 2-12 are intact. No cranial nerve deficit or facial asymmetry.     Motor: Motor function is intact. No weakness.     Gait: Gait normal.  Psychiatric:        Mood and Affect: Mood normal.        Behavior: Behavior normal.       Last depression screening scores    08/01/2024    9:03 AM 06/13/2024   10:07 AM 11/08/2023    1:18 PM  PHQ 2/9 Scores  PHQ - 2 Score 0 0 0  PHQ- 9 Score 0 0  1      Data saved with a previous flowsheet row definition    Last fall risk screening    08/01/2024    9:03 AM  Fall Risk   Falls in the past year? 0  Number falls in past yr: 0  Injury with  Fall? 0  Risk for fall due to : No Fall Risks  Follow up Falls evaluation completed    Last Audit-C alcohol use screening    11/08/2023    1:18 PM  Alcohol Use Disorder Test (AUDIT)  1. How often do you have a drink containing alcohol? 0  2. How many drinks containing alcohol do you have on a typical day when you are drinking? 0  3. How often do you have six or more drinks on one occasion? 0  AUDIT-C Score 0   A score of 3 or more in women, and 4 or more in men indicates increased risk for alcohol abuse, EXCEPT if all of the points are from question 1   No results found for any visits on 08/01/24.  Assessment & Plan    Routine Health Maintenance and Physical Exam  Immunization History  Administered Date(s) Administered    sv, Bivalent, Protein Subunit Rsvpref,pf [place]) 09/27/2023   Influenza Inj Mdck Quad Pf 06/24/2017, 06/10/2018, 05/24/2019, 05/31/2022   Influenza, Seasonal, Injecte, Preservative Fre 06/13/2023, 06/11/2024   Influenza,inj,Quad PF,6+ Mos 05/31/2022   Influenza-Unspecified 07/14/2015, 06/30/2021   PFIZER(Purple Top)SARS-COV-2 Vaccination 03/14/2020, 04/04/2020, 10/17/2020, 05/27/2021   PNEUMOCOCCAL CONJUGATE-20 08/01/2024   Pfizer Covid-19 Vaccine Bivalent Booster 49yrs & up 07/02/2022   Respiratory Syncytial Virus Vaccine,Recomb Aduvanted(Arexvy) 09/27/2023   Td 03/28/2019   Tdap 11/27/2007   Zoster Recombinant(Shingrix) 08/19/2021, 11/13/2021    Health Maintenance  Topic Date Due   Mammogram  08/14/2024   COVID-19 Vaccine (6 - 2025-26 season) 08/17/2024 (Originally 05/14/2024)   HIV Screening  04/06/2025 (Originally 01/21/1976)   DTaP/Tdap/Td (3 - Td or Tdap) 03/27/2029   Colonoscopy  09/10/2031   Pneumococcal Vaccine: 50+ Years  Completed   Influenza Vaccine  Completed   Hepatitis C Screening  Completed   Zoster Vaccines- Shingrix  Completed   Hepatitis B Vaccines 19-59 Average Risk  Aged Out   HPV VACCINES  Aged Out   Meningococcal B Vaccine   Aged Out    Problem List Items Addressed This Visit     Adult hypothyroidism   Relevant Medications   atenolol  (TENORMIN ) 50 MG tablet   levothyroxine  (SYNTHROID ) 75 MCG tablet   Allergic rhinitis   Annual physical exam - Primary   Avitaminosis D   Relevant Orders   Vitamin D  (25 hydroxy)   Essential hypertension   Relevant Medications   atenolol  (TENORMIN ) 50 MG tablet   amLODipine  (NORVASC ) 10 MG tablet   Other Relevant Orders   Comprehensive Metabolic Panel (CMET)   Goiter, non-toxic   Relevant Medications   atenolol  (TENORMIN ) 50 MG tablet   levothyroxine  (SYNTHROID ) 75 MCG tablet   Iron  deficiency anemia   Relevant Orders   CBC   Moderate mixed hyperlipidemia not requiring statin  therapy   Relevant Medications   atenolol  (TENORMIN ) 50 MG tablet   amLODipine  (NORVASC ) 10 MG tablet   Other Relevant Orders   Lipid panel   Neuropathy   Other Visit Diagnoses       Screening for diabetes mellitus       Relevant Orders   HgB A1c     Encounter for screening mammogram for malignant neoplasm of breast       Relevant Orders   MM 3D SCREENING MAMMOGRAM BILATERAL BREAST     Need for pneumococcal vaccine       Relevant Orders   Pneumococcal conjugate vaccine 20-valent (Prevnar 20) (Completed)     Vitamin B12 deficiency       Relevant Orders   Vitamin B12       Assessment and Plan Assessment & Plan Adult Wellness Visit Annual physical examination conducted. Blood pressure is well-controlled at 134/68 mmHg. BMI is within a healthy range at 24. She reports feeling well and has made lifestyle changes, including reducing soda intake, which has positively impacted her weight and overall health. - Ordered A1c, CMP, vitamin B12, vitamin D , and lipid panel - Scheduled mammogram for breast cancer screening - Administered pneumococcal vaccine - Refilled Synthroid  75 mcg daily - Refilled amlodipine  10 mg daily - Refilled atenolol  50 mg daily  Essential hypertension Chronic  condition  Blood pressure is well-controlled with current medication regimen. She monitors blood pressure at home and reports consistent readings. - Continue current antihypertensive medications: amlodipine  10 mg daily and atenolol  50 mg daily  Hypothyroidism and nontoxic goiter Chronic  - Continue Synthroid  75 mcg daily - Ordered thyroid  studies to monitor function  Vitamin D  deficiency Chronic  Vitamin D  levels to be assessed with current lab work. - Ordered vitamin D  level  Iron  deficiency anemia CBC ordered  No current iron  supplementation   Mixed hyperlipidemia Chronic  Lipid levels to be assessed with current lab work. - Ordered lipid panel    Arthritis Reports occasional aches and pains, particularly in the shoulders. Uses meloxicam  as needed and engages in physical therapy and resistance training. Advised against using arthritis strength Tylenol with ibuprofen if taking meloxicam . - Continue meloxicam  as needed - Consider over-the-counter arthritis strength Tylenol without ibuprofen if needed - Continue physical therapy and resistance training  Deficiency of B group vitamins Vitamin B12 levels to be assessed with current lab work. - Ordered vitamin B12 level       Return in about 6 months (around 01/29/2025) for Chronic F/U, HTN, Thyroid , Cholesterol.       Rockie Agent, MD  [facility] [phone] (phone) [phone] (fax)  [facility]

## 2024-08-02 LAB — COMPREHENSIVE METABOLIC PANEL WITH GFR
ALT: 19 IU/L (ref 0–32)
AST: 21 IU/L (ref 0–40)
Albumin: 4.5 g/dL (ref 3.9–4.9)
Alkaline Phosphatase: 94 IU/L (ref 49–135)
BUN/Creatinine Ratio: 19 (ref 12–28)
BUN: 12 mg/dL (ref 8–27)
Bilirubin Total: 0.7 mg/dL (ref 0.0–1.2)
CO2: 23 mmol/L (ref 20–29)
Calcium: 9.9 mg/dL (ref 8.7–10.3)
Chloride: 104 mmol/L (ref 96–106)
Creatinine, Ser: 0.63 mg/dL (ref 0.57–1.00)
Globulin, Total: 3.4 g/dL (ref 1.5–4.5)
Glucose: 97 mg/dL (ref 70–99)
Potassium: 4.1 mmol/L (ref 3.5–5.2)
Sodium: 141 mmol/L (ref 134–144)
Total Protein: 7.9 g/dL (ref 6.0–8.5)
eGFR: 100 mL/min/1.73 (ref >59)

## 2024-08-02 LAB — CBC
Hematocrit: 43 % (ref 34.0–46.6)
Hemoglobin: 13.6 g/dL (ref 11.1–15.9)
MCH: 28.1 pg (ref 26.6–33.0)
MCHC: 31.6 g/dL (ref 31.5–35.7)
MCV: 89 fL (ref 79–97)
Platelets: 317 x10E3/uL (ref 150–450)
RBC: 4.84 x10E6/uL (ref 3.77–5.28)
RDW: 14 % (ref 11.7–15.4)
WBC: 5 x10E3/uL (ref 3.4–10.8)

## 2024-08-02 LAB — LIPID PANEL
Chol/HDL Ratio: 2.9 ratio (ref 0.0–4.4)
Cholesterol, Total: 187 mg/dL (ref 100–199)
HDL: 65 mg/dL (ref >39)
LDL Chol Calc (NIH): 110 mg/dL — ABNORMAL HIGH (ref 0–99)
Triglycerides: 64 mg/dL (ref 0–149)
VLDL Cholesterol Cal: 12 mg/dL (ref 5–40)

## 2024-08-02 LAB — HEMOGLOBIN A1C
Est. average glucose Bld gHb Est-mCnc: 114 mg/dL
Hgb A1c MFr Bld: 5.6 % (ref 4.8–5.6)

## 2024-08-02 LAB — VITAMIN D 25 HYDROXY (VIT D DEFICIENCY, FRACTURES): Vit D, 25-Hydroxy: 16.9 ng/mL — ABNORMAL LOW (ref 30.0–100.0)

## 2024-08-06 ENCOUNTER — Ambulatory Visit: Payer: Self-pay | Admitting: Family Medicine

## 2024-08-06 DIAGNOSIS — E559 Vitamin D deficiency, unspecified: Secondary | ICD-10-CM

## 2024-08-06 MED ORDER — VITAMIN D (ERGOCALCIFEROL) 1.25 MG (50000 UNIT) PO CAPS: 50000.0000 [IU] | ORAL_CAPSULE | ORAL | 1 refills | Status: AC

## 2024-08-07 ENCOUNTER — Encounter: Admitting: Family Medicine

## 2024-08-17 ENCOUNTER — Ambulatory Visit
Admission: RE | Admit: 2024-08-17 | Discharge: 2024-08-17 | Disposition: A | Source: Ambulatory Visit | Attending: Family Medicine

## 2024-08-17 DIAGNOSIS — Z1231 Encounter for screening mammogram for malignant neoplasm of breast: Secondary | ICD-10-CM

## 2025-01-29 ENCOUNTER — Ambulatory Visit: Admitting: Family Medicine
